# Patient Record
Sex: Female | Born: 1975 | Race: White | Hispanic: No | Smoking: Former smoker
Health system: Southern US, Community
[De-identification: ages and names within clinical notes are randomized; demographics above are authoritative.]

## PROBLEM LIST (undated history)

## (undated) DIAGNOSIS — F419 Anxiety disorder, unspecified: Secondary | ICD-10-CM

## (undated) DIAGNOSIS — F32A Depression, unspecified: Secondary | ICD-10-CM

## (undated) DIAGNOSIS — F1921 Other psychoactive substance dependence, in remission: Secondary | ICD-10-CM

## (undated) DIAGNOSIS — M1990 Unspecified osteoarthritis, unspecified site: Secondary | ICD-10-CM

## (undated) DIAGNOSIS — L409 Psoriasis, unspecified: Secondary | ICD-10-CM

## (undated) DIAGNOSIS — F329 Major depressive disorder, single episode, unspecified: Secondary | ICD-10-CM

## (undated) DIAGNOSIS — F1120 Opioid dependence, uncomplicated: Secondary | ICD-10-CM

## (undated) HISTORY — PX: TOOTH EXTRACTION: SUR596

## (undated) HISTORY — PX: ECTOPIC PREGNANCY SURGERY: SHX613

---

## 1997-08-15 ENCOUNTER — Other Ambulatory Visit: Admission: RE | Admit: 1997-08-15 | Discharge: 1997-08-15 | Payer: Self-pay | Admitting: Gynecology

## 1998-06-21 ENCOUNTER — Emergency Department (HOSPITAL_COMMUNITY): Admission: EM | Admit: 1998-06-21 | Discharge: 1998-06-21 | Payer: Self-pay | Admitting: Emergency Medicine

## 1998-08-12 ENCOUNTER — Emergency Department (HOSPITAL_COMMUNITY): Admission: EM | Admit: 1998-08-12 | Discharge: 1998-08-12 | Payer: Self-pay | Admitting: Emergency Medicine

## 1999-08-30 ENCOUNTER — Other Ambulatory Visit: Admission: RE | Admit: 1999-08-30 | Discharge: 1999-08-30 | Payer: Self-pay | Admitting: Gynecology

## 1999-08-30 ENCOUNTER — Ambulatory Visit (HOSPITAL_COMMUNITY): Admission: RE | Admit: 1999-08-30 | Discharge: 1999-08-30 | Payer: Self-pay | Admitting: Obstetrics and Gynecology

## 1999-08-30 ENCOUNTER — Encounter (INDEPENDENT_AMBULATORY_CARE_PROVIDER_SITE_OTHER): Payer: Self-pay

## 1999-12-26 ENCOUNTER — Other Ambulatory Visit: Admission: RE | Admit: 1999-12-26 | Discharge: 1999-12-26 | Payer: Self-pay | Admitting: Gynecology

## 2000-09-13 ENCOUNTER — Inpatient Hospital Stay (HOSPITAL_COMMUNITY): Admission: AD | Admit: 2000-09-13 | Discharge: 2000-09-13 | Payer: Self-pay | Admitting: Obstetrics and Gynecology

## 2000-09-13 ENCOUNTER — Encounter: Payer: Self-pay | Admitting: Gynecology

## 2000-09-15 ENCOUNTER — Other Ambulatory Visit: Admission: RE | Admit: 2000-09-15 | Discharge: 2000-09-15 | Payer: Self-pay | Admitting: Gynecology

## 2002-12-14 ENCOUNTER — Emergency Department (HOSPITAL_COMMUNITY): Admission: EM | Admit: 2002-12-14 | Discharge: 2002-12-14 | Payer: Self-pay | Admitting: Emergency Medicine

## 2003-01-20 ENCOUNTER — Inpatient Hospital Stay (HOSPITAL_COMMUNITY): Admission: AD | Admit: 2003-01-20 | Discharge: 2003-01-20 | Payer: Self-pay | Admitting: Obstetrics & Gynecology

## 2003-01-20 ENCOUNTER — Encounter: Payer: Self-pay | Admitting: Obstetrics & Gynecology

## 2003-01-26 ENCOUNTER — Inpatient Hospital Stay (HOSPITAL_COMMUNITY): Admission: AD | Admit: 2003-01-26 | Discharge: 2003-01-26 | Payer: Self-pay | Admitting: *Deleted

## 2003-06-17 ENCOUNTER — Emergency Department (HOSPITAL_COMMUNITY): Admission: EM | Admit: 2003-06-17 | Discharge: 2003-06-17 | Payer: Self-pay | Admitting: *Deleted

## 2003-09-02 ENCOUNTER — Inpatient Hospital Stay (HOSPITAL_COMMUNITY): Admission: AD | Admit: 2003-09-02 | Discharge: 2003-09-02 | Payer: Self-pay | Admitting: *Deleted

## 2003-09-05 ENCOUNTER — Inpatient Hospital Stay (HOSPITAL_COMMUNITY): Admission: AD | Admit: 2003-09-05 | Discharge: 2003-09-05 | Payer: Self-pay | Admitting: *Deleted

## 2003-09-15 ENCOUNTER — Other Ambulatory Visit: Admission: RE | Admit: 2003-09-15 | Discharge: 2003-09-15 | Payer: Self-pay | Admitting: Obstetrics and Gynecology

## 2003-10-27 ENCOUNTER — Ambulatory Visit (HOSPITAL_COMMUNITY): Admission: AD | Admit: 2003-10-27 | Discharge: 2003-10-27 | Payer: Self-pay | Admitting: Obstetrics and Gynecology

## 2003-10-27 ENCOUNTER — Encounter (INDEPENDENT_AMBULATORY_CARE_PROVIDER_SITE_OTHER): Payer: Self-pay | Admitting: *Deleted

## 2003-11-05 ENCOUNTER — Inpatient Hospital Stay (HOSPITAL_COMMUNITY): Admission: AD | Admit: 2003-11-05 | Discharge: 2003-11-05 | Payer: Self-pay | Admitting: Obstetrics and Gynecology

## 2003-11-06 ENCOUNTER — Emergency Department (HOSPITAL_COMMUNITY): Admission: EM | Admit: 2003-11-06 | Discharge: 2003-11-06 | Payer: Self-pay | Admitting: Unknown Physician Specialty

## 2004-02-08 ENCOUNTER — Emergency Department (HOSPITAL_COMMUNITY): Admission: EM | Admit: 2004-02-08 | Discharge: 2004-02-08 | Payer: Self-pay | Admitting: Emergency Medicine

## 2004-11-04 ENCOUNTER — Inpatient Hospital Stay: Admission: AD | Admit: 2004-11-04 | Discharge: 2004-11-04 | Payer: Self-pay | Admitting: Obstetrics & Gynecology

## 2005-02-10 ENCOUNTER — Emergency Department (HOSPITAL_COMMUNITY): Admission: EM | Admit: 2005-02-10 | Discharge: 2005-02-11 | Payer: Self-pay | Admitting: *Deleted

## 2005-02-11 ENCOUNTER — Ambulatory Visit: Payer: Self-pay | Admitting: Family Medicine

## 2005-02-12 ENCOUNTER — Ambulatory Visit: Payer: Self-pay | Admitting: Cardiology

## 2005-02-23 ENCOUNTER — Emergency Department (HOSPITAL_COMMUNITY): Admission: EM | Admit: 2005-02-23 | Discharge: 2005-02-23 | Payer: Self-pay | Admitting: Emergency Medicine

## 2005-02-28 ENCOUNTER — Ambulatory Visit: Payer: Self-pay | Admitting: Internal Medicine

## 2005-07-07 ENCOUNTER — Ambulatory Visit: Payer: Self-pay | Admitting: Family Medicine

## 2006-03-10 ENCOUNTER — Emergency Department (HOSPITAL_COMMUNITY): Admission: EM | Admit: 2006-03-10 | Discharge: 2006-03-10 | Payer: Self-pay | Admitting: Emergency Medicine

## 2006-03-12 ENCOUNTER — Emergency Department (HOSPITAL_COMMUNITY): Admission: EM | Admit: 2006-03-12 | Discharge: 2006-03-12 | Payer: Self-pay | Admitting: Emergency Medicine

## 2006-04-03 ENCOUNTER — Emergency Department (HOSPITAL_COMMUNITY): Admission: EM | Admit: 2006-04-03 | Discharge: 2006-04-03 | Payer: Self-pay | Admitting: Emergency Medicine

## 2006-04-08 ENCOUNTER — Emergency Department (HOSPITAL_COMMUNITY): Admission: EM | Admit: 2006-04-08 | Discharge: 2006-04-08 | Payer: Self-pay | Admitting: Family Medicine

## 2006-04-29 ENCOUNTER — Emergency Department (HOSPITAL_COMMUNITY): Admission: EM | Admit: 2006-04-29 | Discharge: 2006-04-29 | Payer: Self-pay | Admitting: Family Medicine

## 2006-07-09 ENCOUNTER — Emergency Department (HOSPITAL_COMMUNITY): Admission: EM | Admit: 2006-07-09 | Discharge: 2006-07-10 | Payer: Self-pay | Admitting: *Deleted

## 2006-07-23 ENCOUNTER — Emergency Department (HOSPITAL_COMMUNITY): Admission: EM | Admit: 2006-07-23 | Discharge: 2006-07-23 | Payer: Self-pay | Admitting: Family Medicine

## 2006-08-06 ENCOUNTER — Emergency Department (HOSPITAL_COMMUNITY): Admission: EM | Admit: 2006-08-06 | Discharge: 2006-08-06 | Payer: Self-pay | Admitting: Emergency Medicine

## 2006-08-27 ENCOUNTER — Emergency Department (HOSPITAL_COMMUNITY): Admission: EM | Admit: 2006-08-27 | Discharge: 2006-08-27 | Payer: Self-pay | Admitting: Emergency Medicine

## 2006-09-27 ENCOUNTER — Emergency Department (HOSPITAL_COMMUNITY): Admission: EM | Admit: 2006-09-27 | Discharge: 2006-09-27 | Payer: Self-pay | Admitting: Emergency Medicine

## 2006-10-14 ENCOUNTER — Emergency Department (HOSPITAL_COMMUNITY): Admission: EM | Admit: 2006-10-14 | Discharge: 2006-10-14 | Payer: Self-pay | Admitting: Family Medicine

## 2006-10-26 ENCOUNTER — Ambulatory Visit: Payer: Self-pay | Admitting: Family Medicine

## 2007-01-01 DIAGNOSIS — R42 Dizziness and giddiness: Secondary | ICD-10-CM

## 2007-01-01 DIAGNOSIS — R519 Headache, unspecified: Secondary | ICD-10-CM | POA: Insufficient documentation

## 2007-01-01 DIAGNOSIS — F3289 Other specified depressive episodes: Secondary | ICD-10-CM | POA: Insufficient documentation

## 2007-01-01 DIAGNOSIS — R51 Headache: Secondary | ICD-10-CM

## 2007-01-01 DIAGNOSIS — F329 Major depressive disorder, single episode, unspecified: Secondary | ICD-10-CM | POA: Insufficient documentation

## 2007-01-13 ENCOUNTER — Emergency Department (HOSPITAL_COMMUNITY): Admission: EM | Admit: 2007-01-13 | Discharge: 2007-01-13 | Payer: Self-pay | Admitting: Family Medicine

## 2007-01-18 ENCOUNTER — Emergency Department (HOSPITAL_COMMUNITY): Admission: EM | Admit: 2007-01-18 | Discharge: 2007-01-18 | Payer: Self-pay | Admitting: Family Medicine

## 2007-05-15 ENCOUNTER — Emergency Department (HOSPITAL_COMMUNITY): Admission: EM | Admit: 2007-05-15 | Discharge: 2007-05-15 | Payer: Self-pay | Admitting: Family Medicine

## 2007-06-09 ENCOUNTER — Ambulatory Visit: Payer: Self-pay | Admitting: Family Medicine

## 2007-06-09 DIAGNOSIS — M542 Cervicalgia: Secondary | ICD-10-CM

## 2007-06-16 ENCOUNTER — Telehealth: Payer: Self-pay | Admitting: Family Medicine

## 2007-06-19 ENCOUNTER — Encounter: Admission: RE | Admit: 2007-06-19 | Discharge: 2007-06-19 | Payer: Self-pay | Admitting: Family Medicine

## 2007-06-22 ENCOUNTER — Emergency Department (HOSPITAL_COMMUNITY): Admission: EM | Admit: 2007-06-22 | Discharge: 2007-06-22 | Payer: Self-pay | Admitting: Family Medicine

## 2007-06-29 ENCOUNTER — Encounter: Payer: Self-pay | Admitting: Family Medicine

## 2007-08-11 ENCOUNTER — Telehealth: Payer: Self-pay | Admitting: Family Medicine

## 2007-10-11 ENCOUNTER — Emergency Department (HOSPITAL_COMMUNITY): Admission: EM | Admit: 2007-10-11 | Discharge: 2007-10-11 | Payer: Self-pay | Admitting: Family Medicine

## 2007-11-29 ENCOUNTER — Ambulatory Visit: Payer: Self-pay | Admitting: Family Medicine

## 2007-12-06 ENCOUNTER — Emergency Department (HOSPITAL_COMMUNITY): Admission: EM | Admit: 2007-12-06 | Discharge: 2007-12-06 | Payer: Self-pay | Admitting: Family Medicine

## 2007-12-08 ENCOUNTER — Telehealth: Payer: Self-pay | Admitting: Family Medicine

## 2007-12-12 ENCOUNTER — Emergency Department (HOSPITAL_COMMUNITY): Admission: EM | Admit: 2007-12-12 | Discharge: 2007-12-13 | Payer: Self-pay | Admitting: Emergency Medicine

## 2007-12-20 ENCOUNTER — Telehealth: Payer: Self-pay | Admitting: Family Medicine

## 2007-12-28 ENCOUNTER — Emergency Department (HOSPITAL_COMMUNITY): Admission: EM | Admit: 2007-12-28 | Discharge: 2007-12-29 | Payer: Self-pay | Admitting: Emergency Medicine

## 2008-01-06 ENCOUNTER — Ambulatory Visit: Payer: Self-pay | Admitting: Family Medicine

## 2008-01-06 DIAGNOSIS — M129 Arthropathy, unspecified: Secondary | ICD-10-CM | POA: Insufficient documentation

## 2008-01-06 DIAGNOSIS — L03211 Cellulitis of face: Secondary | ICD-10-CM

## 2008-01-06 DIAGNOSIS — L0201 Cutaneous abscess of face: Secondary | ICD-10-CM

## 2008-01-20 ENCOUNTER — Encounter: Payer: Self-pay | Admitting: Family Medicine

## 2008-02-15 ENCOUNTER — Telehealth: Payer: Self-pay | Admitting: Family Medicine

## 2008-02-23 ENCOUNTER — Telehealth: Payer: Self-pay | Admitting: Family Medicine

## 2008-02-28 ENCOUNTER — Telehealth: Payer: Self-pay | Admitting: Family Medicine

## 2008-04-22 ENCOUNTER — Emergency Department (HOSPITAL_COMMUNITY): Admission: EM | Admit: 2008-04-22 | Discharge: 2008-04-22 | Payer: Self-pay | Admitting: Emergency Medicine

## 2008-05-26 ENCOUNTER — Encounter: Payer: Self-pay | Admitting: Family Medicine

## 2008-06-13 ENCOUNTER — Telehealth: Payer: Self-pay | Admitting: Family Medicine

## 2008-07-03 ENCOUNTER — Telehealth: Payer: Self-pay | Admitting: Family Medicine

## 2008-07-08 ENCOUNTER — Emergency Department (HOSPITAL_COMMUNITY): Admission: EM | Admit: 2008-07-08 | Discharge: 2008-07-08 | Payer: Self-pay | Admitting: Emergency Medicine

## 2008-07-08 ENCOUNTER — Telehealth: Payer: Self-pay | Admitting: Family Medicine

## 2008-07-11 ENCOUNTER — Ambulatory Visit: Payer: Self-pay | Admitting: Family Medicine

## 2008-07-11 DIAGNOSIS — F3160 Bipolar disorder, current episode mixed, unspecified: Secondary | ICD-10-CM | POA: Insufficient documentation

## 2008-08-01 ENCOUNTER — Telehealth (INDEPENDENT_AMBULATORY_CARE_PROVIDER_SITE_OTHER): Payer: Self-pay | Admitting: *Deleted

## 2008-08-15 ENCOUNTER — Inpatient Hospital Stay (HOSPITAL_COMMUNITY): Admission: AD | Admit: 2008-08-15 | Discharge: 2008-08-15 | Payer: Self-pay | Admitting: Obstetrics & Gynecology

## 2008-08-18 ENCOUNTER — Telehealth: Payer: Self-pay | Admitting: Family Medicine

## 2008-10-04 ENCOUNTER — Telehealth: Payer: Self-pay | Admitting: Family Medicine

## 2008-10-04 ENCOUNTER — Ambulatory Visit: Payer: Self-pay | Admitting: Family Medicine

## 2008-10-10 ENCOUNTER — Emergency Department (HOSPITAL_COMMUNITY): Admission: EM | Admit: 2008-10-10 | Discharge: 2008-10-10 | Payer: Self-pay | Admitting: Emergency Medicine

## 2008-10-29 ENCOUNTER — Emergency Department (HOSPITAL_COMMUNITY): Admission: EM | Admit: 2008-10-29 | Discharge: 2008-10-29 | Payer: Self-pay | Admitting: Emergency Medicine

## 2008-11-07 ENCOUNTER — Telehealth: Payer: Self-pay | Admitting: Family Medicine

## 2008-11-28 ENCOUNTER — Emergency Department (HOSPITAL_COMMUNITY): Admission: EM | Admit: 2008-11-28 | Discharge: 2008-11-28 | Payer: Self-pay | Admitting: Emergency Medicine

## 2009-01-19 ENCOUNTER — Emergency Department (HOSPITAL_COMMUNITY): Admission: EM | Admit: 2009-01-19 | Discharge: 2009-01-20 | Payer: Self-pay | Admitting: Emergency Medicine

## 2009-02-04 ENCOUNTER — Emergency Department (HOSPITAL_COMMUNITY): Admission: EM | Admit: 2009-02-04 | Discharge: 2009-02-04 | Payer: Self-pay | Admitting: Emergency Medicine

## 2009-02-06 ENCOUNTER — Emergency Department (HOSPITAL_COMMUNITY): Admission: EM | Admit: 2009-02-06 | Discharge: 2009-02-06 | Payer: Self-pay | Admitting: Emergency Medicine

## 2009-02-06 ENCOUNTER — Telehealth: Payer: Self-pay | Admitting: Family Medicine

## 2009-02-17 ENCOUNTER — Emergency Department (HOSPITAL_COMMUNITY): Admission: EM | Admit: 2009-02-17 | Discharge: 2009-02-17 | Payer: Self-pay | Admitting: Emergency Medicine

## 2009-03-12 ENCOUNTER — Inpatient Hospital Stay (HOSPITAL_COMMUNITY): Admission: AD | Admit: 2009-03-12 | Discharge: 2009-03-12 | Payer: Self-pay | Admitting: Obstetrics and Gynecology

## 2009-04-04 ENCOUNTER — Emergency Department (HOSPITAL_COMMUNITY): Admission: EM | Admit: 2009-04-04 | Discharge: 2009-04-04 | Payer: Self-pay | Admitting: Emergency Medicine

## 2009-04-17 ENCOUNTER — Emergency Department (HOSPITAL_COMMUNITY): Admission: EM | Admit: 2009-04-17 | Discharge: 2009-04-18 | Payer: Self-pay | Admitting: Emergency Medicine

## 2009-04-22 ENCOUNTER — Emergency Department (HOSPITAL_COMMUNITY): Admission: EM | Admit: 2009-04-22 | Discharge: 2009-04-22 | Payer: Self-pay | Admitting: Emergency Medicine

## 2009-05-03 ENCOUNTER — Encounter: Payer: Self-pay | Admitting: Family Medicine

## 2009-05-04 ENCOUNTER — Telehealth: Payer: Self-pay | Admitting: Family Medicine

## 2009-05-10 ENCOUNTER — Encounter: Admission: RE | Admit: 2009-05-10 | Discharge: 2009-05-10 | Payer: Self-pay | Admitting: Neurosurgery

## 2009-05-12 ENCOUNTER — Emergency Department: Payer: Self-pay | Admitting: Emergency Medicine

## 2009-05-13 ENCOUNTER — Emergency Department (HOSPITAL_COMMUNITY): Admission: EM | Admit: 2009-05-13 | Discharge: 2009-05-13 | Payer: Self-pay | Admitting: Emergency Medicine

## 2009-05-16 ENCOUNTER — Emergency Department: Payer: Self-pay | Admitting: Emergency Medicine

## 2009-05-27 ENCOUNTER — Emergency Department: Payer: Self-pay | Admitting: Unknown Physician Specialty

## 2009-05-29 ENCOUNTER — Telehealth: Payer: Self-pay | Admitting: Family Medicine

## 2009-05-30 ENCOUNTER — Telehealth: Payer: Self-pay | Admitting: Family Medicine

## 2009-05-30 ENCOUNTER — Ambulatory Visit: Payer: Self-pay | Admitting: Family Medicine

## 2009-07-10 ENCOUNTER — Telehealth: Payer: Self-pay | Admitting: Family Medicine

## 2009-07-13 ENCOUNTER — Ambulatory Visit: Payer: Self-pay | Admitting: Family Medicine

## 2009-07-13 DIAGNOSIS — K5289 Other specified noninfective gastroenteritis and colitis: Secondary | ICD-10-CM

## 2009-08-20 ENCOUNTER — Emergency Department (HOSPITAL_COMMUNITY): Admission: EM | Admit: 2009-08-20 | Discharge: 2009-08-20 | Payer: Self-pay | Admitting: Emergency Medicine

## 2009-10-17 ENCOUNTER — Emergency Department (HOSPITAL_COMMUNITY): Admission: EM | Admit: 2009-10-17 | Discharge: 2009-10-17 | Payer: Self-pay | Admitting: Emergency Medicine

## 2009-11-25 ENCOUNTER — Emergency Department (HOSPITAL_COMMUNITY): Admission: EM | Admit: 2009-11-25 | Discharge: 2009-11-25 | Payer: Self-pay | Admitting: Emergency Medicine

## 2010-03-16 ENCOUNTER — Emergency Department (HOSPITAL_COMMUNITY): Admission: EM | Admit: 2010-03-16 | Discharge: 2010-03-17 | Payer: Self-pay | Admitting: Emergency Medicine

## 2010-04-29 ENCOUNTER — Emergency Department (HOSPITAL_COMMUNITY)
Admission: EM | Admit: 2010-04-29 | Discharge: 2010-04-30 | Payer: Self-pay | Source: Home / Self Care | Admitting: Emergency Medicine

## 2010-05-07 ENCOUNTER — Emergency Department (HOSPITAL_COMMUNITY)
Admission: EM | Admit: 2010-05-07 | Discharge: 2010-05-08 | Payer: Self-pay | Source: Home / Self Care | Admitting: Emergency Medicine

## 2010-05-08 LAB — URINALYSIS, ROUTINE W REFLEX MICROSCOPIC
Bilirubin Urine: NEGATIVE
Ketones, ur: NEGATIVE mg/dL
Leukocytes, UA: NEGATIVE
Nitrite: NEGATIVE
Protein, ur: NEGATIVE mg/dL
Specific Gravity, Urine: 1.006 (ref 1.005–1.030)
Urine Glucose, Fasting: NEGATIVE mg/dL
Urobilinogen, UA: 0.2 mg/dL (ref 0.0–1.0)
pH: 6.5 (ref 5.0–8.0)

## 2010-05-08 LAB — POCT I-STAT, CHEM 8
BUN: 6 mg/dL (ref 6–23)
Calcium, Ion: 1.06 mmol/L — ABNORMAL LOW (ref 1.12–1.32)
Chloride: 108 mEq/L (ref 96–112)
Creatinine, Ser: 1 mg/dL (ref 0.4–1.2)
Glucose, Bld: 95 mg/dL (ref 70–99)
HCT: 42 % (ref 36.0–46.0)
Hemoglobin: 14.3 g/dL (ref 12.0–15.0)
Potassium: 4.3 mEq/L (ref 3.5–5.1)
Sodium: 140 mEq/L (ref 135–145)
TCO2: 22 mmol/L (ref 0–100)

## 2010-05-08 LAB — URINE MICROSCOPIC-ADD ON

## 2010-05-08 LAB — POCT PREGNANCY, URINE: Preg Test, Ur: NEGATIVE

## 2010-05-11 ENCOUNTER — Emergency Department: Payer: Self-pay | Admitting: Emergency Medicine

## 2010-05-12 ENCOUNTER — Encounter: Payer: Self-pay | Admitting: Obstetrics & Gynecology

## 2010-05-23 NOTE — Progress Notes (Signed)
Summary: Patient needs something called in for N&V  Phone Note Call from Patient Call back at [phone]   Caller: Patient Call For: Nelwyn Salisbury MD Reason for Call: Acute Illness Summary of Call: Sick for 2 days with vomitting and diarrhea - patient currently has no health insurance and would like Dr. Clent Ridges to call something in for her.  Initial call taken by: Everrett Coombe,  July 10, 2009 3:17 PM  Follow-up for Phone Call        use Imodium AD, also call in Phenergan 25 mg tabs, use q 4 hours as needed nausea, #30 with no rf Follow-up by: Nelwyn Salisbury MD,  July 10, 2009 4:19 PM  Additional Follow-up for Phone Call Additional follow up Details #1::        Rx Called In Additional Follow-up by: Raechel Ache, RN,  July 10, 2009 4:38 PM

## 2010-05-23 NOTE — Letter (Signed)
Summary: [facility]  [facility]   Imported By: Maryln Gottron 05/10/2009 13:47:37  _____________________________________________________________________  External Attachment:    Type:   Image     Comment:   External Document

## 2010-05-23 NOTE — Progress Notes (Signed)
Summary: refill hydrocodone  Phone Note From Pharmacy   Caller: [facility]  [place]. #[phone]* Call For: fry  Summary of Call: refill hydrocodone 5/500 1 by mouth every 6 hours as needed for pain Initial call taken by: Alfred Levins, CMA,  May 04, 2009 11:58 AM  Follow-up for Phone Call        call in #30 only. She needs to see me before any more after that Follow-up by: Nelwyn Salisbury MD,  May 04, 2009 1:24 PM  Additional Follow-up for Phone Call Additional follow up Details #1::        Phone call completed, Pharmacist called Additional Follow-up by: Alfred Levins, CMA,  May 04, 2009 1:25 PM    New/Updated Medications: HYDROCODONE-ACETAMINOPHEN 5-500 MG TABS (HYDROCODONE-ACETAMINOPHEN) 1 by mouth every 6 hours as needed for pain Prescriptions: HYDROCODONE-ACETAMINOPHEN 5-500 MG TABS (HYDROCODONE-ACETAMINOPHEN) 1 by mouth every 6 hours as needed for pain  #30 x 0   Entered by:   Alfred Levins, CMA   Authorized by:   Nelwyn Salisbury MD   Signed by:   Alfred Levins, CMA on 05/04/2009   Method used:   Telephoned to ...       [facility]  [place]. [phone]* (retail)       [address]       [place], [place]  [address]       Ph: [phone]       Fax: [phone]   RxID:   [id number]

## 2010-05-23 NOTE — Progress Notes (Signed)
Summary: vicodin & valium for numbness flareup  Phone Note Call from Patient Call back at [facility] Phone [phone]   Summary of Call: rEQUESTING REFILLS OF vICODIN & vALIUM BECAUSE OF FLAREUP OF NUMBNESS IN FINGERS, cervical disc flattened.  [place] Duck, HP Initial call taken by: Rudy Jew, RN,  May 29, 2009 12:26 PM  Follow-up for Phone Call        NO she needs an OV first Follow-up by: Nelwyn Salisbury MD,  May 29, 2009 1:47 PM  Additional Follow-up for Phone Call Additional follow up Details #1::        Set ov for tomorrow. Additional Follow-up by: Rudy Jew, RN,  May 29, 2009 1:55 PM

## 2010-05-23 NOTE — Progress Notes (Signed)
Summary: refill hydrocodone  Phone Note From Pharmacy   Caller: [facility]  [place]. #[phone]* Call For: fry  Summary of Call: refill hydrocodone 5/500 1 by mouth every 6 hours as needed for pain  Pt no showed for appt today Initial call taken by: Alfred Levins, CMA,  May 30, 2009 12:13 PM  Follow-up for Phone Call        NO not without an OV Follow-up by: Nelwyn Salisbury MD,  May 30, 2009 1:43 PM  Additional Follow-up for Phone Call Additional follow up Details #1::        Phone call completed, Pharmacist called Additional Follow-up by: Alfred Levins, CMA,  May 30, 2009 2:22 PM

## 2010-05-23 NOTE — Assessment & Plan Note (Signed)
Summary: FU ON NAUSEA/NJR   Vital Signs:  Patient profile:   35 year old female Weight:      208 pounds BMI:     28.31 Temp:     98 degrees F oral BP sitting:   130 / 84  (left arm) Cuff size:   regular  Vitals Entered By: Raechel Ache, RN (July 13, 2009 11:20 AM) CC: Sick 1-2 weeks with nausea, vomiting, sleeping all the time, fever and diarrhea. Also asking to go back on Prozac.   History of Present Illness: Here for 10 days of weakness, nausea and vomitting, fever to 101 degrees, mild cramps, and some diarrhea. She is using Phenergan, and this helps a lot. She has felt better for the past few days, and thinks she is over the worst of it. No respiratory or urinary symptoms. Drinking fluids. Also she wants to start back on Prozac for her depression. She had been seeing the [facility] for this, but she stopped seeing thme 4 months ago. She stopped taking all her depression meds 4 months ago as well because of side effects like weight gain, drowsiness, and hair loss.   Allergies: 1)  ! Pcn 2)  ! Sulfa  Past History:  Past Medical History: Reviewed history from 07/11/2008 and no changes required. Fainting Spells Ulcers Migraines Kidney Stones UTIs Herpes Depression Dizziness or vertigo psoriasis, sees Dr. Jorja Loa diffuse joint pains, sees Dr. Corliss Skains for rheumatology care neck and bilateral arm pains, sees Dr. Tressie Stalker  Review of Systems  The patient denies anorexia, fever, weight loss, weight gain, vision loss, decreased hearing, hoarseness, chest pain, syncope, dyspnea on exertion, peripheral edema, prolonged cough, headaches, hemoptysis, melena, hematochezia, severe indigestion/heartburn, hematuria, incontinence, genital sores, muscle weakness, suspicious skin lesions, transient blindness, difficulty walking, unusual weight change, abnormal bleeding, enlarged lymph nodes, angioedema, breast masses, and testicular masses.    Physical Exam  General:   Well-developed,well-nourished,in no acute distress; alert,appropriate and cooperative throughout examination Lungs:  Normal respiratory effort, chest expands symmetrically. Lungs are clear to auscultation, no crackles or wheezes. Heart:  Normal rate and regular rhythm. S1 and S2 normal without gallop, murmur, click, rub or other extra sounds. Abdomen:  Bowel sounds positive,abdomen soft and non-tender without masses, organomegaly or hernias noted. Psych:  Cognition and judgment appear intact. Alert and cooperative with normal attention span and concentration. No apparent delusions, illusions, hallucinations   Impression & Recommendations:  Problem # 1:  GASTROENTERITIS (ICD-558.9)  Problem # 2:  DEPRESSION (ICD-311)  The following medications were removed from the medication list:    Clonazepam 0.5 Mg Tabs (Clonazepam) .Marland Kitchen... Three times a day as needed anxiety Her updated medication list for this problem includes:    Prozac 40 Mg Caps (Fluoxetine hcl) ..... Once daily  Complete Medication List: 1)  Imitrex 100 Mg Tabs (Sumatriptan succinate) .... As needed 2)  Humira Pen 40 Mg/0.33ml Kit (Adalimumab) .... Every other week 3)  Hydrocodone-acetaminophen 5-500 Mg Tabs (Hydrocodone-acetaminophen) .Marland Kitchen.. 1 by mouth every 6 hours as needed for pain 4)  Prozac 40 Mg Caps (Fluoxetine hcl) .... Once daily  Patient Instructions: 1)  This seems to be a viral illness, and she seems to be over the worst of it. Continue fluids and Phenergan as needed . she should be back to normal by next week. Start back on Prozac.  2)  Please schedule a follow-up appointment in 1 month. Given a work excuse for 07-09-09 through 07-12-09. Prescriptions: PROZAC 40 MG CAPS (FLUOXETINE HCL) once  daily  #30 x 5   Entered and Authorized by:   Nelwyn Salisbury MD   Signed by:   Nelwyn Salisbury MD on 07/13/2009   Method used:   Electronically to        [facility]. [phone]* (retail)       [address]        [place], [place]  [address]       Ph: [phone]       Fax: [phone]   RxID:   [id number]

## 2010-07-05 LAB — CBC
HCT: 41.5 % (ref 36.0–46.0)
MCHC: 33 g/dL (ref 30.0–36.0)
MCV: 94.7 fL (ref 78.0–100.0)
Platelets: 235 10*3/uL (ref 150–400)
RDW: 14.2 % (ref 11.5–15.5)
WBC: 7.6 10*3/uL (ref 4.0–10.5)

## 2010-07-05 LAB — COMPREHENSIVE METABOLIC PANEL
Albumin: 3.4 g/dL — ABNORMAL LOW (ref 3.5–5.2)
BUN: 5 mg/dL — ABNORMAL LOW (ref 6–23)
Calcium: 8.5 mg/dL (ref 8.4–10.5)
Creatinine, Ser: 0.81 mg/dL (ref 0.4–1.2)
Glucose, Bld: 102 mg/dL — ABNORMAL HIGH (ref 70–99)
Potassium: 4 mEq/L (ref 3.5–5.1)
Total Protein: 5.7 g/dL — ABNORMAL LOW (ref 6.0–8.3)

## 2010-07-05 LAB — DIFFERENTIAL
Basophils Relative: 1 % (ref 0–1)
Lymphocytes Relative: 33 % (ref 12–46)
Lymphs Abs: 2.5 10*3/uL (ref 0.7–4.0)
Monocytes Absolute: 0.4 10*3/uL (ref 0.1–1.0)
Monocytes Relative: 6 % (ref 3–12)
Neutro Abs: 4.4 10*3/uL (ref 1.7–7.7)
Neutrophils Relative %: 58 % (ref 43–77)

## 2010-07-05 LAB — URINE CULTURE
Colony Count: NO GROWTH
Culture  Setup Time: 201108071756

## 2010-07-05 LAB — URINALYSIS, ROUTINE W REFLEX MICROSCOPIC
Bilirubin Urine: NEGATIVE
Glucose, UA: NEGATIVE mg/dL
Ketones, ur: NEGATIVE mg/dL
Specific Gravity, Urine: 1.005 (ref 1.005–1.030)
pH: 7 (ref 5.0–8.0)

## 2010-07-05 LAB — POCT PREGNANCY, URINE: Preg Test, Ur: NEGATIVE

## 2010-07-18 ENCOUNTER — Emergency Department (HOSPITAL_COMMUNITY): Payer: Medicaid Other

## 2010-07-18 ENCOUNTER — Emergency Department (HOSPITAL_COMMUNITY)
Admission: EM | Admit: 2010-07-18 | Discharge: 2010-07-18 | Disposition: A | Discharge status: Home / Self Care | Payer: Medicaid Other | Attending: Emergency Medicine | Admitting: Emergency Medicine

## 2010-07-18 DIAGNOSIS — F3289 Other specified depressive episodes: Secondary | ICD-10-CM | POA: Insufficient documentation

## 2010-07-18 DIAGNOSIS — F329 Major depressive disorder, single episode, unspecified: Secondary | ICD-10-CM | POA: Insufficient documentation

## 2010-07-18 DIAGNOSIS — R1013 Epigastric pain: Secondary | ICD-10-CM | POA: Insufficient documentation

## 2010-07-18 DIAGNOSIS — Z79899 Other long term (current) drug therapy: Secondary | ICD-10-CM | POA: Insufficient documentation

## 2010-07-18 DIAGNOSIS — R070 Pain in throat: Secondary | ICD-10-CM | POA: Insufficient documentation

## 2010-07-18 DIAGNOSIS — J45909 Unspecified asthma, uncomplicated: Secondary | ICD-10-CM | POA: Insufficient documentation

## 2010-07-18 DIAGNOSIS — F17200 Nicotine dependence, unspecified, uncomplicated: Secondary | ICD-10-CM | POA: Insufficient documentation

## 2010-07-18 DIAGNOSIS — R51 Headache: Secondary | ICD-10-CM | POA: Insufficient documentation

## 2010-07-18 DIAGNOSIS — R079 Chest pain, unspecified: Secondary | ICD-10-CM | POA: Insufficient documentation

## 2010-07-18 DIAGNOSIS — J189 Pneumonia, unspecified organism: Secondary | ICD-10-CM | POA: Insufficient documentation

## 2010-07-18 DIAGNOSIS — R110 Nausea: Secondary | ICD-10-CM | POA: Insufficient documentation

## 2010-07-18 DIAGNOSIS — J3489 Other specified disorders of nose and nasal sinuses: Secondary | ICD-10-CM | POA: Insufficient documentation

## 2010-07-18 LAB — COMPREHENSIVE METABOLIC PANEL
ALT: 15 U/L (ref 0–35)
AST: 17 U/L (ref 0–37)
Alkaline Phosphatase: 57 U/L (ref 39–117)
Calcium: 8.7 mg/dL (ref 8.4–10.5)
GFR calc Af Amer: >60 mL/min (ref >60)
Potassium: 4 mEq/L (ref 3.5–5.1)
Sodium: 138 mEq/L (ref 135–145)
Total Protein: 6.9 g/dL (ref 6.0–8.3)

## 2010-07-18 LAB — CBC
Hemoglobin: 12.2 g/dL (ref 12.0–15.0)
MCHC: 33.2 g/dL (ref 30.0–36.0)
RDW: 13.6 % (ref 11.5–15.5)
WBC: 5.7 10*3/uL (ref 4.0–10.5)

## 2010-07-18 LAB — DIFFERENTIAL
Basophils Absolute: 0 10*3/uL (ref 0.0–0.1)
Basophils Relative: 1 % (ref 0–1)
Eosinophils Relative: 1 % (ref 0–5)
Lymphocytes Relative: 31 % (ref 12–46)
Monocytes Absolute: 0.4 10*3/uL (ref 0.1–1.0)
Neutro Abs: 3.5 10*3/uL (ref 1.7–7.7)

## 2010-07-18 LAB — URINALYSIS, ROUTINE W REFLEX MICROSCOPIC
Bilirubin Urine: NEGATIVE
Nitrite: NEGATIVE
Specific Gravity, Urine: 1.005 (ref 1.005–1.030)
Urobilinogen, UA: 0.2 mg/dL (ref 0.0–1.0)
pH: 7 (ref 5.0–8.0)

## 2010-07-18 LAB — POCT PREGNANCY, URINE: Preg Test, Ur: NEGATIVE

## 2010-07-22 ENCOUNTER — Emergency Department (HOSPITAL_COMMUNITY): Payer: Medicaid Other

## 2010-07-22 ENCOUNTER — Emergency Department (HOSPITAL_COMMUNITY)
Admission: EM | Admit: 2010-07-22 | Discharge: 2010-07-22 | Disposition: A | Discharge status: Home / Self Care | Payer: Medicaid Other | Attending: Emergency Medicine | Admitting: Emergency Medicine

## 2010-07-22 DIAGNOSIS — M545 Low back pain, unspecified: Secondary | ICD-10-CM | POA: Insufficient documentation

## 2010-07-22 DIAGNOSIS — M542 Cervicalgia: Secondary | ICD-10-CM | POA: Insufficient documentation

## 2010-07-22 DIAGNOSIS — M2550 Pain in unspecified joint: Secondary | ICD-10-CM | POA: Insufficient documentation

## 2010-07-22 DIAGNOSIS — R5381 Other malaise: Secondary | ICD-10-CM | POA: Insufficient documentation

## 2010-07-22 DIAGNOSIS — J189 Pneumonia, unspecified organism: Secondary | ICD-10-CM | POA: Insufficient documentation

## 2010-07-22 DIAGNOSIS — F17200 Nicotine dependence, unspecified, uncomplicated: Secondary | ICD-10-CM | POA: Insufficient documentation

## 2010-07-22 DIAGNOSIS — M546 Pain in thoracic spine: Secondary | ICD-10-CM | POA: Insufficient documentation

## 2010-07-22 DIAGNOSIS — R079 Chest pain, unspecified: Secondary | ICD-10-CM | POA: Insufficient documentation

## 2010-07-22 DIAGNOSIS — J45909 Unspecified asthma, uncomplicated: Secondary | ICD-10-CM | POA: Insufficient documentation

## 2010-07-22 DIAGNOSIS — R05 Cough: Secondary | ICD-10-CM | POA: Insufficient documentation

## 2010-07-22 DIAGNOSIS — IMO0001 Reserved for inherently not codable concepts without codable children: Secondary | ICD-10-CM | POA: Insufficient documentation

## 2010-07-22 DIAGNOSIS — R509 Fever, unspecified: Secondary | ICD-10-CM | POA: Insufficient documentation

## 2010-07-22 DIAGNOSIS — R059 Cough, unspecified: Secondary | ICD-10-CM | POA: Insufficient documentation

## 2010-07-22 DIAGNOSIS — F3289 Other specified depressive episodes: Secondary | ICD-10-CM | POA: Insufficient documentation

## 2010-07-22 DIAGNOSIS — F329 Major depressive disorder, single episode, unspecified: Secondary | ICD-10-CM | POA: Insufficient documentation

## 2010-07-24 LAB — CBC
HCT: 40.9 % (ref 36.0–46.0)
Hemoglobin: 13.7 g/dL (ref 12.0–15.0)
MCHC: 33.5 g/dL (ref 30.0–36.0)
RBC: 4.2 MIL/uL (ref 3.87–5.11)

## 2010-07-24 LAB — BASIC METABOLIC PANEL
CO2: 24 mEq/L (ref 19–32)
Calcium: 9 mg/dL (ref 8.4–10.5)
Chloride: 107 mEq/L (ref 96–112)
GFR calc Af Amer: >60 mL/min (ref >60)
Potassium: 4.3 mEq/L (ref 3.5–5.1)
Sodium: 136 mEq/L (ref 135–145)

## 2010-07-24 LAB — WET PREP, GENITAL: Clue Cells Wet Prep HPF POC: NONE SEEN

## 2010-07-24 LAB — URINALYSIS, ROUTINE W REFLEX MICROSCOPIC
Bilirubin Urine: NEGATIVE
Glucose, UA: NEGATIVE mg/dL
Hgb urine dipstick: NEGATIVE
Specific Gravity, Urine: <1.005 — ABNORMAL LOW (ref 1.005–1.030)
pH: 6.5 (ref 5.0–8.0)

## 2010-07-24 LAB — DIFFERENTIAL
Basophils Relative: 1 % (ref 0–1)
Eosinophils Relative: 4 % (ref 0–5)
Monocytes Absolute: 0.5 10*3/uL (ref 0.1–1.0)
Monocytes Relative: 7 % (ref 3–12)
Neutro Abs: 2.9 10*3/uL (ref 1.7–7.7)

## 2010-07-24 LAB — GC/CHLAMYDIA PROBE AMP, GENITAL: Chlamydia, DNA Probe: NEGATIVE

## 2010-07-25 LAB — URINALYSIS, ROUTINE W REFLEX MICROSCOPIC
Ketones, ur: NEGATIVE mg/dL
Nitrite: NEGATIVE
Protein, ur: NEGATIVE mg/dL
pH: 6.5 (ref 5.0–8.0)

## 2010-07-26 ENCOUNTER — Emergency Department (HOSPITAL_COMMUNITY): Payer: Medicaid Other

## 2010-07-26 ENCOUNTER — Encounter (HOSPITAL_COMMUNITY): Payer: Self-pay | Admitting: Radiology

## 2010-07-26 ENCOUNTER — Emergency Department (HOSPITAL_COMMUNITY)
Admission: EM | Admit: 2010-07-26 | Discharge: 2010-07-26 | Disposition: A | Discharge status: Home / Self Care | Payer: Medicaid Other | Attending: Emergency Medicine | Admitting: Emergency Medicine

## 2010-07-26 DIAGNOSIS — R000 Tachycardia, unspecified: Secondary | ICD-10-CM | POA: Insufficient documentation

## 2010-07-26 DIAGNOSIS — F329 Major depressive disorder, single episode, unspecified: Secondary | ICD-10-CM | POA: Insufficient documentation

## 2010-07-26 DIAGNOSIS — R112 Nausea with vomiting, unspecified: Secondary | ICD-10-CM | POA: Insufficient documentation

## 2010-07-26 DIAGNOSIS — J329 Chronic sinusitis, unspecified: Secondary | ICD-10-CM | POA: Insufficient documentation

## 2010-07-26 DIAGNOSIS — R51 Headache: Secondary | ICD-10-CM | POA: Insufficient documentation

## 2010-07-26 DIAGNOSIS — J45909 Unspecified asthma, uncomplicated: Secondary | ICD-10-CM | POA: Insufficient documentation

## 2010-07-26 DIAGNOSIS — F3289 Other specified depressive episodes: Secondary | ICD-10-CM | POA: Insufficient documentation

## 2010-07-26 HISTORY — DX: Major depressive disorder, single episode, unspecified: F32.9

## 2010-07-26 HISTORY — DX: Depression, unspecified: F32.A

## 2010-07-26 HISTORY — DX: Other psychoactive substance dependence, in remission: F19.21

## 2010-07-26 HISTORY — DX: Opioid dependence, uncomplicated: F11.20

## 2010-07-26 LAB — URINALYSIS, ROUTINE W REFLEX MICROSCOPIC
Bilirubin Urine: NEGATIVE
Glucose, UA: NEGATIVE mg/dL
Hgb urine dipstick: NEGATIVE
Ketones, ur: >80 mg/dL — AB
Protein, ur: NEGATIVE mg/dL
Urobilinogen, UA: 0.2 mg/dL (ref 0.0–1.0)

## 2010-07-26 LAB — RAPID URINE DRUG SCREEN, HOSP PERFORMED
Amphetamines: NOT DETECTED
Barbiturates: NOT DETECTED
Benzodiazepines: NOT DETECTED
Tetrahydrocannabinol: POSITIVE — AB

## 2010-07-26 LAB — POCT I-STAT, CHEM 8
BUN: 7 mg/dL (ref 6–23)
Calcium, Ion: 1.02 mmol/L — ABNORMAL LOW (ref 1.12–1.32)
Creatinine, Ser: 0.8 mg/dL (ref 0.4–1.2)
Hemoglobin: 14.3 g/dL (ref 12.0–15.0)
TCO2: 24 mmol/L (ref 0–100)

## 2010-07-27 LAB — URINE CULTURE

## 2010-07-31 LAB — URINALYSIS, ROUTINE W REFLEX MICROSCOPIC
Glucose, UA: NEGATIVE mg/dL
Protein, ur: NEGATIVE mg/dL
Specific Gravity, Urine: <1.005 — ABNORMAL LOW (ref 1.005–1.030)

## 2010-07-31 LAB — CBC
HCT: 38.7 % (ref 36.0–46.0)
MCV: 96.5 fL (ref 78.0–100.0)
Platelets: 164 10*3/uL (ref 150–400)
RDW: 13.2 % (ref 11.5–15.5)

## 2010-07-31 LAB — COMPREHENSIVE METABOLIC PANEL
Albumin: 3.6 g/dL (ref 3.5–5.2)
BUN: 8 mg/dL (ref 6–23)
Creatinine, Ser: 0.65 mg/dL (ref 0.4–1.2)
Potassium: 4.2 mEq/L (ref 3.5–5.1)
Total Protein: 6 g/dL (ref 6.0–8.3)

## 2010-07-31 LAB — WET PREP, GENITAL
Trich, Wet Prep: NONE SEEN
Yeast Wet Prep HPF POC: NONE SEEN

## 2010-09-03 NOTE — Assessment & Plan Note (Signed)
[facility]                                 ON-CALL NOTE   NAME:GRILLSWynn, Kernes                       MRN:          [id number]  DATE:07/08/2008                            DOB:          06-Jul-1975    PRIMARY CARE PHYSICIAN:  Jeannett Senior A. Clent Ridges, MD   The patient's husband calls me again, second phone call today regarding  his wife, Alexandra Holt.  See details of prior dictation and phone note for  details.  They went to behavioral health, had an assessment.  Assessment  was that the patient should be admitted to Psychiatric Unit.  The  patient apparently was not deemed committable.  I was told that they  signed paperwork and left.  I wonder if this means that they signed out  against medical advice.  Husband called and asked me if there is  anything that I could call in for his wife.  I explained to him and to  the patient that under no circumstances would I think that that is  appropriate to call in psychiatric medications on a patient I have never  seen or evaluated and one who is acutely decompensating, who just got  out of drug rehabilitation.  I recommended that they go to [facility]  to the Psychiatric Assessment Unit.  I also recommended that if there  are no beds available at [facility] which is the case  today that possibly they could in the emergency room help facilitate a  transfer to [facility] or another Psychiatric Unit in the state.     Juleen China, MD    STC/MedQ  DD: 07/08/2008  DT: 07/09/2008  Job #: [id number]

## 2010-09-06 NOTE — Op Note (Signed)
Alexandra Holt, Alexandra Holt                       ACCOUNT NO.:  [id number]   MEDICAL RECORD NO.:  [id number]                   PATIENT TYPE:  AMB   LOCATION:  SDC                                  FACILITY:  [facility]   PHYSICIAN:  Carrington Clamp, M.D.              DATE OF BIRTH:  03-22-1976   DATE OF PROCEDURE:  10/27/2003  DATE OF DISCHARGE:                                 OPERATIVE REPORT   PREOPERATIVE DIAGNOSES:  Suspected ectopic pregnancy.   POSTOPERATIVE DIAGNOSES:  Left ectopic pregnancy.   PROCEDURE:  Diagnostic laparoscopy followed by left salpingectomy.   SURGEON:  Carrington Clamp, M.D.   ANESTHESIA:  General endotracheal anesthesia.   ESTIMATED BLOOD LOSS:  200 mL intraabdominally and minimal during the  procedure.   IV FLUIDS:  1000 mL.   URINE OUTPUT:  Not measured.   COMPLICATIONS:  None.   FINDINGS:  Left ampullary ectopic with positive hemoperitoneum. The right  tube was missing status post salpingectomy on that side. Normal uterus,  appendix, liver edge were seen. There was a small amount of endometriosis in  the broad ligament on the right hand side close to the ureter and therefore  could not be cauterized.   PATHOLOGY:  Left tube and ectopic.   MEDICATIONS:  None.   COUNTS:  Correct x3.   TECHNIQUE:  After adequate general anesthesia was achieved, the patient was  prepped and draped in the usual sterile fashion, dorsal lithotomy position,  speculum was placed in the vagina and uterine manipulator placed in the  cervix. The bladder was drained with a red rubber catheter during the  procedure. Attention was then turned to the abdomen where an infraumbilical  incision was made approximately 2 cm and the Veress needle passed into the  abdominal cavity without aspiration of bowel contents or blood.  The abdomen  was insufflated and then the 10 mm trocar passed without complications.  The  above findings were noted and a 5 mm trocar was then placed  suprapubically  through a stab incision made by a scalpel.  This was done under direct  visualization of the camera. A second 5 mm trocar was placed again after a  stab incision was made with the scalpel under direct visualization with the  camera on the right hand side.  A grasper was used to manipulate the left  tube and ectopic while triple polar cautery was then used to come across the  tube and underneath the mesosalpinx and remove the entire tube. Additional  cautery was used on the edges of the mesosalpinx that were proximal to the  body to ensure hemostasis. The ectopic was then placed in an endobag that  was placed through the 10 mm trocar site under visualization of the 5 mm  scope. The 10 mm scope was placed in the abdomen and the dissection field  was noted to be hemostatic. Irrigation was performed to remove as much of  the intraabdominal blood as possible. The endometriosis was noted but unable  to be cauterized adequately secondary to its location to the ureter.  All  instruments were then withdrawn from the abdomen and abdomen desufflated.  The 10 mm trocar site fascia was closed with a figure-of-eight stitch of 2-0  Vicryl. Two subcutaneous stitches were used to close the two smaller  incisions. Dermabond was used to close all of them, all instruments  withdrawn from the vagina and the patient tolerated the procedure well and  was returned to the recovery room in stable condition.                                               Carrington Clamp, M.D.    MH/MEDQ  D:  10/27/2003  T:  10/28/2003  Job:  [id number]

## 2010-09-06 NOTE — Op Note (Signed)
[facility]  Patient:    Alexandra Holt, Alexandra Holt                    MRN: [id number] Proc. Date: 08/30/99 Adm. Date:  [id number] Disc. Date: [id number] Attending:  Brynda Peon CC:         Leatha Gilding. Mezer, M.D.                           Operative Report  PREOPERATIVE DIAGNOSIS:       Probable right ectopic pregnancy.  POSTOPERATIVE DIAGNOSIS:      Right ectopic pregnancy.  OPERATION:                    Diagnostic laparoscopy, partial right salpingectomy.  SURGEON:                      Cynthia P. Ashley Royalty, M.D.  ASSISTANT:  ANESTHESIA:                   General endotracheal anesthesia.  ESTIMATED BLOOD LOSS:         Minimal.  COMPLICATIONS:                None.  FINDINGS:                     On inspection of the abdomen after placement of the laparoscope, the left tube and ovary were felt to be normal.  The fimbria were luxuriant.  There were no adhesions.  The anterior cul-de-sac was normal.  The posterior cul-de-sac had a moderate amount of blood.  On the right side, the ovary was normal and freely mobile.  The tube appeared normal until about the midisthmic area and from the midisthmic area out to the ampulla, it was ischemic, dilated, and there was clot at the fimbria.  DESCRIPTION OF PROCEDURE:     The patient was taken to the operating room and after the induction of adequate general endotracheal anesthesia, was placed in the dorsal lithotomy position and prepped and draped in the usual fashion.  The cervix was  visualized and a single tooth tenaculum was used to grasp the cervix on its anterior lip.  A Hulka uterine manipulator was placed.  The bladder was drained  with a red rubber catheter.  The IUD was removed intact by pulling on the string. This was easily accomplished.  Attention was next turned to the abdomen.  A vertical interumbilical incision was made.  The Veress needle was inserted into the peritoneal space.   Proper placement of the needle was tested by noting a negative aspirate and then free flow of saline through the Veress needle again with a negative aspirate and then by noting the response of a drop of saline placed at the hub of the needle to negative pressure as the abdominal wall was elevated.  The  pneumoperitoneum was created with 2.5 liters of CO2 using a Stryker automatic insufflator.  A disposable 10 mm trocar was inserted and its proper placement noted with the laparoscope.  The above findings were noted.  Two 5 mm ports were introduced in the right and left lower quadrants each time transilluminating to  avoid hitting a vessel with the trocar.  The tube was elevated with a blunt pickup and the tripolar cautery was used to excise the tube from the midisthmic area to the ampulla along the mesosalpinx.  The free end of the tube was grasped with a  pickup through the laparoscope and removed through the umbilical sleeve.  The clot that had been at the end of the tube was also removed and sent with the specimen. No bleeding was encountered.  The remainder of the abdomen appeared normal. The laparoscope and the instruments were removed from the abdomen.  The pneumoperitoneum was allowed to escape.  The trocar sleeves were removed.  The incisions were closed subcuticularly with 4-0 Vicryl Rapide.  The instruments were removed from the vagina and the procedure was terminated.  The incisions were injected with a total of 10 cc of 0.5% Marcaine with epinephrine. DD:  08/30/99 TD:  09/02/99 Job: [id number]

## 2011-01-15 ENCOUNTER — Emergency Department (HOSPITAL_COMMUNITY)
Admission: EM | Admit: 2011-01-15 | Discharge: 2011-01-15 | Disposition: A | Discharge status: Home / Self Care | Payer: Medicaid Other | Attending: Emergency Medicine | Admitting: Emergency Medicine

## 2011-01-15 DIAGNOSIS — F410 Panic disorder [episodic paroxysmal anxiety] without agoraphobia: Secondary | ICD-10-CM | POA: Insufficient documentation

## 2011-01-15 DIAGNOSIS — F19939 Other psychoactive substance use, unspecified with withdrawal, unspecified: Secondary | ICD-10-CM | POA: Insufficient documentation

## 2011-01-15 DIAGNOSIS — T50901A Poisoning by unspecified drugs, medicaments and biological substances, accidental (unintentional), initial encounter: Secondary | ICD-10-CM | POA: Insufficient documentation

## 2011-01-15 DIAGNOSIS — F1920 Other psychoactive substance dependence, uncomplicated: Secondary | ICD-10-CM | POA: Insufficient documentation

## 2011-01-15 DIAGNOSIS — T50904A Poisoning by unspecified drugs, medicaments and biological substances, undetermined, initial encounter: Secondary | ICD-10-CM | POA: Insufficient documentation

## 2011-01-22 ENCOUNTER — Other Ambulatory Visit (HOSPITAL_COMMUNITY): Payer: Medicaid Other

## 2011-01-22 ENCOUNTER — Emergency Department (HOSPITAL_COMMUNITY)
Admission: EM | Admit: 2011-01-22 | Discharge: 2011-01-22 | Disposition: A | Discharge status: Home / Self Care | Payer: Self-pay | Attending: Emergency Medicine | Admitting: Emergency Medicine

## 2011-01-22 DIAGNOSIS — M7989 Other specified soft tissue disorders: Secondary | ICD-10-CM

## 2011-01-22 DIAGNOSIS — R252 Cramp and spasm: Secondary | ICD-10-CM | POA: Insufficient documentation

## 2011-01-22 DIAGNOSIS — M79609 Pain in unspecified limb: Secondary | ICD-10-CM | POA: Insufficient documentation

## 2011-01-22 LAB — URINALYSIS, ROUTINE W REFLEX MICROSCOPIC
Glucose, UA: NEGATIVE
pH: 7

## 2011-01-30 LAB — POCT RAPID STREP A: Streptococcus, Group A Screen (Direct): NEGATIVE

## 2011-04-26 ENCOUNTER — Encounter (HOSPITAL_COMMUNITY): Payer: Self-pay | Admitting: Emergency Medicine

## 2011-04-26 ENCOUNTER — Emergency Department (HOSPITAL_COMMUNITY): Payer: Self-pay

## 2011-04-26 ENCOUNTER — Emergency Department (HOSPITAL_COMMUNITY)
Admission: EM | Admit: 2011-04-26 | Discharge: 2011-04-26 | Disposition: A | Discharge status: Home / Self Care | Payer: Self-pay | Attending: Emergency Medicine | Admitting: Emergency Medicine

## 2011-04-26 DIAGNOSIS — R6883 Chills (without fever): Secondary | ICD-10-CM | POA: Insufficient documentation

## 2011-04-26 DIAGNOSIS — R0602 Shortness of breath: Secondary | ICD-10-CM | POA: Insufficient documentation

## 2011-04-26 DIAGNOSIS — R0982 Postnasal drip: Secondary | ICD-10-CM | POA: Insufficient documentation

## 2011-04-26 DIAGNOSIS — J3489 Other specified disorders of nose and nasal sinuses: Secondary | ICD-10-CM | POA: Insufficient documentation

## 2011-04-26 DIAGNOSIS — R059 Cough, unspecified: Secondary | ICD-10-CM | POA: Insufficient documentation

## 2011-04-26 DIAGNOSIS — H9209 Otalgia, unspecified ear: Secondary | ICD-10-CM | POA: Insufficient documentation

## 2011-04-26 DIAGNOSIS — R070 Pain in throat: Secondary | ICD-10-CM | POA: Insufficient documentation

## 2011-04-26 DIAGNOSIS — J45909 Unspecified asthma, uncomplicated: Secondary | ICD-10-CM | POA: Insufficient documentation

## 2011-04-26 DIAGNOSIS — F3289 Other specified depressive episodes: Secondary | ICD-10-CM | POA: Insufficient documentation

## 2011-04-26 DIAGNOSIS — F329 Major depressive disorder, single episode, unspecified: Secondary | ICD-10-CM | POA: Insufficient documentation

## 2011-04-26 DIAGNOSIS — R51 Headache: Secondary | ICD-10-CM | POA: Insufficient documentation

## 2011-04-26 DIAGNOSIS — R05 Cough: Secondary | ICD-10-CM | POA: Insufficient documentation

## 2011-04-26 DIAGNOSIS — J189 Pneumonia, unspecified organism: Secondary | ICD-10-CM | POA: Insufficient documentation

## 2011-04-26 MED ORDER — LEVOFLOXACIN 750 MG PO TABS: 750.0000 mg | ORAL_TABLET | Freq: Every day | ORAL | Status: AC

## 2011-04-26 MED ORDER — ALBUTEROL SULFATE HFA 108 (90 BASE) MCG/ACT IN AERS
2.0000 | INHALATION_SPRAY | RESPIRATORY_TRACT | Status: DC | PRN
  Administered 2011-04-26: 2 via RESPIRATORY_TRACT
  Filled 2011-04-26: qty 6.7

## 2011-04-26 MED ORDER — AZITHROMYCIN 250 MG PO TABS: ORAL_TABLET | ORAL | Status: DC

## 2011-04-26 NOTE — ED Notes (Signed)
Pt sts she is out of her albuterol inhaler

## 2011-04-26 NOTE — ED Notes (Signed)
Pt c/o sob x1 wk, productive cough w/yellow/brown colored sputum, and rib pain d/t coughing. Reports taking Nyquil x3 days w/o any relief

## 2011-04-26 NOTE — ED Provider Notes (Signed)
History     CSN: [id number]  Arrival date & time 04/26/11  1191   First MD Initiated Contact with Patient 04/26/11 [phone]      Chief Complaint  Patient presents with  . Shortness of Breath    (Consider location/radiation/quality/duration/timing/severity/associated sxs/prior treatment) Patient is a 36 y.o. female presenting with shortness of breath. The history is provided by the patient.  Shortness of Breath  The current episode started 3 to 5 days ago. The onset was gradual. The problem occurs continuously. The problem has been unchanged. The problem is moderate. The symptoms are relieved by nothing (Pt ran outof her albuterol inhaler). The symptoms are aggravated by nothing. Associated symptoms include rhinorrhea, sore throat, cough, shortness of breath and wheezing. Pertinent negatives include no chest pain, no chest pressure, no orthopnea and no fever. Her past medical history is significant for asthma. She has received no recent medical care.  URI sx x 7 days with SOBx 3 days. Taking OTC meds with minimal relief.  Past Medical History  Diagnosis Date  . Asthma   . H/O: drug dependency   . Narcotic addiction   . Depression     Past Surgical History  Procedure Date  . Ectopic pregnancy surgery   . Tooth extraction     No family history on file.  History  Substance Use Topics  . Smoking status: Current Everyday Smoker  . Smokeless tobacco: Not on file  . Alcohol Use: Yes    Review of Systems  Constitutional: Positive for chills. Negative for fever.  HENT: Positive for ear pain, congestion, sore throat, rhinorrhea, postnasal drip and sinus pressure. Negative for nosebleeds, trouble swallowing, neck pain, neck stiffness, voice change and ear discharge.   Eyes: Negative for pain and visual disturbance.  Respiratory: Positive for cough, shortness of breath and wheezing. Negative for chest tightness.   Cardiovascular: Negative for chest pain, orthopnea and leg swelling.    Gastrointestinal: Negative for nausea, vomiting and abdominal pain.  Genitourinary: Negative for dysuria and hematuria.  Musculoskeletal: Negative for back pain and gait problem.  Skin: Negative for color change and rash.  Neurological: Negative for syncope, weakness and headaches.  Psychiatric/Behavioral: Negative for confusion.    Allergies  Penicillins and Sulfonamide derivatives  Home Medications   Current Outpatient Rx  Name Route Sig Dispense Refill  . ADALIMUMAB 40 MG/0.8ML [place] KIT Subcutaneous Inject 40 mg into the skin every 14 (fourteen) days.      Marland Kitchen BUPRENORPHINE HCL-NALOXONE HCL 4-1 MG SL FILM Sublingual Place 1 Film under the tongue daily.      Marland Kitchen FLUOXETINE HCL 40 MG PO CAPS Oral Take 80 mg by mouth daily.        BP 119/53  Pulse 78  Temp(Src) 98 F (36.7 C) (Oral)  Resp 18  SpO2 95%  LMP 04/18/2011  Physical Exam  Nursing note and vitals reviewed. Constitutional: She is oriented to person, place, and time. She appears well-developed and well-nourished.       Uncomfortable appearing  HENT:  Head: Normocephalic and atraumatic. No trismus in the jaw.  Right Ear: Tympanic membrane, external ear and ear canal normal.  Left Ear: Tympanic membrane, external ear and ear canal normal.  Nose: Rhinorrhea present.  Mouth/Throat: Uvula is midline and mucous membranes are normal. No uvula swelling. Posterior oropharyngeal erythema present. No oropharyngeal exudate or posterior oropharyngeal edema.  Eyes: Conjunctivae are normal. Pupils are equal, round, and reactive to light. Right eye exhibits no discharge. Left eye exhibits no  discharge.  Neck: Normal range of motion. Neck supple.  Cardiovascular: Normal rate, regular rhythm and normal heart sounds.   Pulmonary/Chest: Effort normal and breath sounds normal. No stridor. She has no wheezes. She exhibits no tenderness.  Abdominal: Soft. She exhibits no distension. There is no tenderness.  Musculoskeletal: She exhibits no  edema and no tenderness.  Lymphadenopathy:    She has no cervical adenopathy.  Neurological: She is alert and oriented to person, place, and time.  Skin: Skin is warm and dry. No rash noted.  Psychiatric: Her behavior is normal.    ED Course  Procedures (including critical care time)  Labs Reviewed - No data to display Dg Chest 2 View  04/26/2011  *RADIOLOGY REPORT*  Clinical Data: Cough, congestion, chills, shortness of breath  CHEST - 2 VIEW  Comparison: July 22, 2010  Findings: Patchy airspace opacity is now present within the left midlung.  The right lung is clear.  The cardiac silhouette, mediastinum, pulmonary vasculature are within normal limits.  IMPRESSION: New left mid lung pneumonia.  Original Report Authenticated By: Brandon Melnick, M.D.     Dx 1: community acquired pneumonia   MDM  X-ray reviewed, left sided pneumonia. No wheezing on exam to suggest asthma exacerbation. Good candidate for outpatient treatment. No recent hospitalization, we'll treat for CAP.        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  [address] [place], [place] 04/26/11 [phone]

## 2011-04-26 NOTE — ED Provider Notes (Signed)
Medical screening examination/treatment/procedure(s) were performed by non-physician practitioner and as supervising physician I was immediately available for consultation/collaboration.   Gwyneth Sprout, MD 04/26/11 1338

## 2011-04-26 NOTE — ED Notes (Signed)
PT. REPORTS SOB WITH PRODUCTIVE COUGH AND CHEST CONGESTION , CHILLS , SWEATS AND BILATERAL EAR ACHES FOR 3 DAYS.

## 2011-05-17 ENCOUNTER — Emergency Department (HOSPITAL_COMMUNITY)
Admission: EM | Admit: 2011-05-17 | Discharge: 2011-05-17 | Disposition: A | Discharge status: Home / Self Care | Payer: Self-pay | Attending: Emergency Medicine | Admitting: Emergency Medicine

## 2011-05-17 ENCOUNTER — Encounter (HOSPITAL_COMMUNITY): Payer: Self-pay | Admitting: *Deleted

## 2011-05-17 DIAGNOSIS — F3289 Other specified depressive episodes: Secondary | ICD-10-CM | POA: Insufficient documentation

## 2011-05-17 DIAGNOSIS — J45909 Unspecified asthma, uncomplicated: Secondary | ICD-10-CM | POA: Insufficient documentation

## 2011-05-17 DIAGNOSIS — R110 Nausea: Secondary | ICD-10-CM | POA: Insufficient documentation

## 2011-05-17 DIAGNOSIS — Z79899 Other long term (current) drug therapy: Secondary | ICD-10-CM | POA: Insufficient documentation

## 2011-05-17 DIAGNOSIS — F329 Major depressive disorder, single episode, unspecified: Secondary | ICD-10-CM | POA: Insufficient documentation

## 2011-05-17 DIAGNOSIS — G43909 Migraine, unspecified, not intractable, without status migrainosus: Secondary | ICD-10-CM | POA: Insufficient documentation

## 2011-05-17 DIAGNOSIS — H53149 Visual discomfort, unspecified: Secondary | ICD-10-CM | POA: Insufficient documentation

## 2011-05-17 MED ORDER — SUMATRIPTAN SUCCINATE 6 MG/0.5ML ~~LOC~~ SOLN
6.0000 mg | Freq: Once | SUBCUTANEOUS | Status: AC
  Administered 2011-05-17: 6 mg via SUBCUTANEOUS
  Filled 2011-05-17: qty 0.5

## 2011-05-17 MED ORDER — SUMATRIPTAN SUCCINATE 100 MG PO TABS: 100.0000 mg | ORAL_TABLET | ORAL | Status: DC | PRN

## 2011-05-17 NOTE — ED Notes (Signed)
The pt has had a migraine headache since yesterday.  She ran out of her imitrex and she could not get her doctor to call in another rx.  No nv

## 2011-05-17 NOTE — ED Provider Notes (Signed)
History     CSN: [id number]  Arrival date & time 05/17/11  1901   First MD Initiated Contact with Patient 05/17/11 2107      Chief Complaint  Patient presents with  . Migraine    (Consider location/radiation/quality/duration/timing/severity/associated sxs/prior treatment) HPI Comments: Patient reports that she began having a migraine headache earlier today.  She reports that this headache is no different than headaches that she has had in the past.  She reports that the pain is located on her left forehead.  She reports that she usually takes imitrex for her migraines, which helps.  She had a prescription for Imitrex from her PCP but recently ran out.  She called the office of her PCP today to get more medication, but the doctor on call refused to call her in medication.  She tried taking ibuprofen prior to arrival in the ED, but reports that it did not help.  Patient is a 36 y.o. female presenting with migraine.  Migraine The problem has been unchanged. Associated symptoms include headaches and nausea. Pertinent negatives include no chills, fever, neck pain, numbness, rash, visual change, vomiting or weakness.    Past Medical History  Diagnosis Date  . Asthma   . H/O: drug dependency   . Narcotic addiction   . Depression   . Migraine     Past Surgical History  Procedure Date  . Ectopic pregnancy surgery   . Tooth extraction     History reviewed. No pertinent family history.  History  Substance Use Topics  . Smoking status: Current Everyday Smoker  . Smokeless tobacco: Not on file  . Alcohol Use: Yes    OB History    Grav Para Term Preterm Abortions TAB SAB Ect Mult Living                  Review of Systems  Constitutional: Negative for fever and chills.  HENT: Negative for neck pain and neck stiffness.   Eyes: Positive for photophobia. Negative for pain, discharge, redness and visual disturbance.  Respiratory: Negative for shortness of breath.     Gastrointestinal: Positive for nausea. Negative for vomiting.  Musculoskeletal: Negative for gait problem.  Skin: Negative for rash.  Neurological: Positive for headaches. Negative for dizziness, syncope, weakness and numbness.  Psychiatric/Behavioral: Negative for confusion.    Allergies  Penicillins and Sulfonamide derivatives  Home Medications   Current Outpatient Rx  Name Route Sig Dispense Refill  . ADALIMUMAB 40 MG/0.8ML [place] KIT Subcutaneous Inject 40 mg into the skin every 14 (fourteen) days.      Marland Kitchen BUPRENORPHINE HCL-NALOXONE HCL 4-1 MG SL FILM Sublingual Place 1 Film under the tongue daily.      Marland Kitchen FLUOXETINE HCL 40 MG PO CAPS Oral Take 80 mg by mouth daily.      . IBUPROFEN 800 MG PO TABS Oral Take 800 mg by mouth once.      BP 130/75  Pulse 86  Temp(Src) 97.7 F (36.5 C) (Oral)  Resp 18  SpO2 96%  LMP 04/18/2011  Physical Exam  Nursing note and vitals reviewed. Constitutional: She is oriented to person, place, and time. She appears well-developed and well-nourished. No distress.  HENT:  Head: Normocephalic and atraumatic.  Eyes: EOM are normal. Pupils are equal, round, and reactive to light.  Neck: Normal range of motion. Neck supple.  Cardiovascular: Normal rate, regular rhythm and normal heart sounds.   Pulmonary/Chest: Effort normal and breath sounds normal. No respiratory distress. She has no wheezes.  Neurological: She is alert and oriented to person, place, and time. She has normal strength. No cranial nerve deficit or sensory deficit. Coordination and gait normal.  Skin: Skin is warm and dry. No rash noted. She is not diaphoretic.  Psychiatric: She has a normal mood and affect.    ED Course  Procedures (including critical care time)  Labs Reviewed - No data to display No results found.   No diagnosis found.  10:08 PM Reassessed patient.  She reports that her pain has completely resolved.  MDM  Patient with history of migraine headaches comes in  today with her typical migraine headache.  She reports that headache is no different today than her typical migraine.  VSS.  Afebrile.  Neurological exam normal.  Pain given dose of Imitrex in the ED and headache completely resolved.          Pascal Lux [place], PA-C 05/18/11 [phone]

## 2011-05-17 NOTE — ED Notes (Signed)
Patient with migraine headache.  Has history of same and ran out of medication

## 2011-05-18 NOTE — ED Provider Notes (Signed)
Medical screening examination/treatment/procedure(s) were performed by non-physician practitioner and as supervising physician I was immediately available for consultation/collaboration.  Doug Sou, MD 05/18/11 0040

## 2011-11-26 ENCOUNTER — Telehealth: Payer: Self-pay | Admitting: Family Medicine

## 2011-11-26 NOTE — Telephone Encounter (Signed)
Pt called and said that she was a pt of Dr Carver Fila a couple of years ago, but then she had Medicaid and could no longer see Dr Clent Ridges. Pt has been self pay now and will be getting new insurance through her work. Pt is req to re-est with Dr Clent Ridges. Pls advise.

## 2011-11-26 NOTE — Telephone Encounter (Signed)
NO she would need to establish with someone else, thanks

## 2011-11-27 NOTE — Telephone Encounter (Signed)
Called and informed pt that Dr Clent Ridges has declined req to re-est and recommended pt to est with another provider as noted. Pt said that she made an appt with another physician elsewhere.

## 2011-12-08 ENCOUNTER — Emergency Department (HOSPITAL_COMMUNITY)
Admission: EM | Admit: 2011-12-08 | Discharge: 2011-12-08 | Disposition: A | Discharge status: Home / Self Care | Payer: Self-pay | Attending: Emergency Medicine | Admitting: Emergency Medicine

## 2011-12-08 ENCOUNTER — Encounter (HOSPITAL_COMMUNITY): Payer: Self-pay | Admitting: *Deleted

## 2011-12-08 DIAGNOSIS — R51 Headache: Secondary | ICD-10-CM | POA: Insufficient documentation

## 2011-12-08 DIAGNOSIS — F17200 Nicotine dependence, unspecified, uncomplicated: Secondary | ICD-10-CM | POA: Insufficient documentation

## 2011-12-08 MED ORDER — CLONAZEPAM 0.5 MG PO TABS: 0.5000 mg | ORAL_TABLET | Freq: Two times a day (BID) | ORAL | Status: DC | PRN

## 2011-12-08 MED ORDER — DEXAMETHASONE SODIUM PHOSPHATE 10 MG/ML IJ SOLN
10.0000 mg | Freq: Once | INTRAMUSCULAR | Status: AC
  Administered 2011-12-08: 10 mg via INTRAMUSCULAR
  Filled 2011-12-08: qty 1

## 2011-12-08 MED ORDER — KETOROLAC TROMETHAMINE 30 MG/ML IJ SOLN
30.0000 mg | Freq: Once | INTRAMUSCULAR | Status: AC
  Administered 2011-12-08: 30 mg via INTRAMUSCULAR
  Filled 2011-12-08: qty 1

## 2011-12-08 MED ORDER — METOCLOPRAMIDE HCL 5 MG/ML IJ SOLN
10.0000 mg | Freq: Once | INTRAMUSCULAR | Status: AC
  Administered 2011-12-08: 10 mg via INTRAMUSCULAR
  Filled 2011-12-08: qty 2

## 2011-12-08 MED ORDER — DIPHENHYDRAMINE HCL 25 MG PO CAPS
25.0000 mg | ORAL_CAPSULE | Freq: Once | ORAL | Status: AC
  Administered 2011-12-08: 25 mg via ORAL
  Filled 2011-12-08: qty 1

## 2011-12-08 NOTE — ED Provider Notes (Signed)
Medical screening examination/treatment/procedure(s) were performed by non-physician practitioner and as supervising physician I was immediately available for consultation/collaboration.  Olivia Mackie, MD 12/08/11 [phone]

## 2011-12-08 NOTE — ED Provider Notes (Signed)
History     CSN: [id number]  Arrival date & time 12/08/11  0401   First MD Initiated Contact with Patient 12/08/11 [phone]      Chief Complaint  Patient presents with  . Migraine   HPI  History provided by the patient. 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 Patient states that headache began over the past 6 hours prior to arrival. She has pain in the right frontal for head area and right posterior scalp. This is a common location for her migraine-type headaches. Patient states that she believes her headache was caused from an increased sodium meal at dinnertime. She reports taking her daughter to a [place]. Patient also states that she missed her evening dose of Suboxone she normally takes her narcotic withdrawal. Patient states she is not think her headache is caused from this in the Suboxone as is normally causes lower extremity cramping and discomfort or abdominal cramps. Patient also has some complaints of nausea and thirst but has no other associated symptoms. She did not try taking any medications for her headache. She denies any associated fever, vomiting, numbness or weakness in extremities.    Past Medical History  Diagnosis Date  . Asthma   . H/O: drug dependency   . Narcotic addiction   . Depression   . Migraine     Past Surgical History  Procedure Date  . Ectopic pregnancy surgery   . Tooth extraction     No family history on file.  History  Substance Use Topics  . Smoking status: Current Everyday Smoker  . Smokeless tobacco: Not on file  . Alcohol Use: Yes    OB History    Grav Para Term Preterm Abortions TAB SAB Ect Mult Living                  Review of Systems  Constitutional: Negative for chills.  Eyes: Negative for photophobia.  Gastrointestinal: Positive for nausea. Negative for vomiting,  abdominal pain, diarrhea and constipation.  Neurological: Positive for headaches. Negative for light-headedness.    Allergies  Penicillins and Sulfonamide derivatives  Home Medications   Current Outpatient Rx  Name Route Sig Dispense Refill  . ADALIMUMAB 40 MG/0.8ML [place] KIT Subcutaneous Inject 40 mg into the skin every 14 (fourteen) days.      Marland Kitchen BUPRENORPHINE HCL-NALOXONE HCL 4-1 MG SL FILM Sublingual Place 0.5 Film under the tongue daily.     Marland Kitchen FLUOXETINE HCL 40 MG PO CAPS Oral Take 80 mg by mouth daily.      . SUMATRIPTAN SUCCINATE 100 MG PO TABS Oral Take 1 tablet (100 mg total) by mouth every 2 (two) hours as needed for migraine. 6 tablet 0    BP 121/58  Pulse 76  Temp 98 F (36.7 C) (Oral)  Resp 20  SpO2 100%  LMP 11/06/2011  Physical Exam  Nursing note and vitals reviewed. Constitutional: She is oriented to person, place, and time. She appears well-developed and well-nourished. No distress.  HENT:  Head: Normocephalic and atraumatic.  Eyes: Conjunctivae and EOM are normal. Pupils are equal, round, and reactive to light.  Neck: Normal range of motion. Neck supple.       No meningeal signs  Cardiovascular: Normal rate and regular rhythm.   Pulmonary/Chest: Effort normal and breath sounds normal. No respiratory distress. She has no wheezes. She has no rales.  Abdominal: Soft.  There is no tenderness. There is no rebound and no guarding.  Neurological: She is alert and oriented to person, place, and time. She has normal strength. No cranial nerve deficit or sensory deficit. Gait normal.  Skin: Skin is warm and dry.  Psychiatric: She has a normal mood and affect. Her behavior is normal.    ED Course  Procedures       1. Headache       MDM  Patient seen and evaluated. Patient in no acute distress. Patient with normal nonfocal neuro exam no concerning symptoms for headache.   Migraine headache cocktail ordered. Patient reports having good followup her  psychiatrist in [place] later today at 1:30 she receives Suboxone treatment from.   Patient reports the headache has improved significantly. Will discharge at this time.  Phill Mutter [place], [place] 12/08/11 [phone]

## 2011-12-08 NOTE — ED Notes (Signed)
Pt c/o migraine headache x 6 hours; states missed last nights dose of suboxone; nauseated

## 2012-07-15 ENCOUNTER — Emergency Department (HOSPITAL_COMMUNITY)
Admission: EM | Admit: 2012-07-15 | Discharge: 2012-07-15 | Disposition: A | Discharge status: Home / Self Care | Payer: Self-pay | Attending: Emergency Medicine | Admitting: Emergency Medicine

## 2012-07-15 ENCOUNTER — Encounter (HOSPITAL_COMMUNITY): Payer: Self-pay | Admitting: Emergency Medicine

## 2012-07-15 DIAGNOSIS — F329 Major depressive disorder, single episode, unspecified: Secondary | ICD-10-CM | POA: Insufficient documentation

## 2012-07-15 DIAGNOSIS — J45909 Unspecified asthma, uncomplicated: Secondary | ICD-10-CM | POA: Insufficient documentation

## 2012-07-15 DIAGNOSIS — F17200 Nicotine dependence, unspecified, uncomplicated: Secondary | ICD-10-CM | POA: Insufficient documentation

## 2012-07-15 DIAGNOSIS — R110 Nausea: Secondary | ICD-10-CM | POA: Insufficient documentation

## 2012-07-15 DIAGNOSIS — F3289 Other specified depressive episodes: Secondary | ICD-10-CM | POA: Insufficient documentation

## 2012-07-15 DIAGNOSIS — G43909 Migraine, unspecified, not intractable, without status migrainosus: Secondary | ICD-10-CM | POA: Insufficient documentation

## 2012-07-15 DIAGNOSIS — H53149 Visual discomfort, unspecified: Secondary | ICD-10-CM | POA: Insufficient documentation

## 2012-07-15 DIAGNOSIS — Z79899 Other long term (current) drug therapy: Secondary | ICD-10-CM | POA: Insufficient documentation

## 2012-07-15 MED ORDER — SUMATRIPTAN SUCCINATE 6 MG/0.5ML ~~LOC~~ SOLN
6.0000 mg | Freq: Once | SUBCUTANEOUS | Status: AC
  Administered 2012-07-15: 6 mg via SUBCUTANEOUS
  Filled 2012-07-15: qty 0.5

## 2012-07-15 MED ORDER — ONDANSETRON 8 MG PO TBDP
8.0000 mg | ORAL_TABLET | Freq: Once | ORAL | Status: AC
Start: 2012-07-15 — End: 2012-07-15
  Administered 2012-07-15: 8 mg via ORAL
  Filled 2012-07-15: qty 1

## 2012-07-15 NOTE — ED Provider Notes (Signed)
History    This chart was scribed for non-physician practitioner working with Ward Givens, MD by Leone Payor, ED Scribe. This patient was seen in room WTR8/WTR8 and the patient's care was started at 2025.   CSN: [id number]  Arrival date & time 07/15/12  2025   None     Chief Complaint  Patient presents with  . Migraine     The history is provided by the patient. No language interpreter was used.    Alexandra Holt is a 37 y.o. female who presents to the Emergency Department complaining of a new, constant, unchanged migraine headache to the right side of head and right eye that started last night. Pt describes the pain as throbbing. She took an 800 mg ibuprofen today with no relief. She has associated visual disturbances and nausea. She denies vomiting with this episode. Pt has h/o migraines. This is her usual pain. There are no unusual symptoms with this migraine. Imitrex works well for her. She is currently out of this medication. She has photophobia and phonophobia. Nothing makes the pain better.  Pt is a current everyday smoker and occasional alcohol user.  Past Medical History  Diagnosis Date  . Asthma   . H/O: drug dependency   . Narcotic addiction   . Depression   . Migraine     Past Surgical History  Procedure Laterality Date  . Ectopic pregnancy surgery    . Tooth extraction      Family History  Problem Relation Age of Onset  . Diabetes Other   . Hypertension Other     History  Substance Use Topics  . Smoking status: Current Every Day Smoker    Types: Cigarettes  . Smokeless tobacco: Not on file  . Alcohol Use: Yes     Comment: occ    OB History   Grav Para Term Preterm Abortions TAB SAB Ect Mult Living                  Review of Systems  Constitutional: Negative for fever.  HENT: Negative for congestion, rhinorrhea, neck pain, neck stiffness, dental problem and sinus pressure.   Eyes: Negative for photophobia, discharge, redness and visual  disturbance.  Respiratory: Negative for shortness of breath.   Cardiovascular: Negative for chest pain.  Gastrointestinal: Positive for nausea. Negative for vomiting.  Musculoskeletal: Negative for gait problem.  Skin: Negative for rash.  Neurological: Positive for headaches. Negative for syncope, speech difficulty, weakness, light-headedness and numbness.  Psychiatric/Behavioral: Negative for confusion.    Allergies  Penicillins and Sulfonamide derivatives  Home Medications   Current Outpatient Rx  Name  Route  Sig  Dispense  Refill  . adalimumab (HUMIRA) 40 MG/0.8ML injection   Subcutaneous   Inject 40 mg into the skin every 14 (fourteen) days.           . Buprenorphine HCl-Naloxone HCl (SUBOXONE) 4-1 MG FILM   Sublingual   Place 0.5 Film under the tongue daily.          Marland Kitchen EXPIRED: clonazePAM (KLONOPIN) 0.5 MG tablet   Oral   Take 1 tablet (0.5 mg total) by mouth 2 (two) times daily as needed for anxiety.   1 tablet   0   . FLUoxetine (PROZAC) 40 MG capsule   Oral   Take 80 mg by mouth daily.           Marland Kitchen EXPIRED: SUMAtriptan (IMITREX) 100 MG tablet   Oral   Take 1 tablet (100  mg total) by mouth every 2 (two) hours as needed for migraine.   6 tablet   0     BP 139/64  Pulse 63  Temp(Src) 97.7 F (36.5 C) (Oral)  Resp 18  SpO2 100%  LMP 07/04/2012  Physical Exam  Nursing note and vitals reviewed. Constitutional: She is oriented to person, place, and time. She appears well-developed and well-nourished. No distress.  HENT:  Head: Normocephalic and atraumatic.  Right Ear: Tympanic membrane, external ear and ear canal normal.  Left Ear: Tympanic membrane, external ear and ear canal normal.  Nose: Nose normal.  Mouth/Throat: Uvula is midline, oropharynx is clear and moist and mucous membranes are normal.  Eyes: Conjunctivae, EOM and lids are normal. Pupils are equal, round, and reactive to light. Right eye exhibits no nystagmus. Left eye exhibits no  nystagmus.  Neck: Normal range of motion. Neck supple. No tracheal deviation present.  No meningismus.  Cardiovascular: Normal rate and regular rhythm.   Pulmonary/Chest: Effort normal and breath sounds normal. No respiratory distress.  Abdominal: Soft. There is no tenderness.  Musculoskeletal: Normal range of motion.       Cervical back: She exhibits normal range of motion, no tenderness and no bony tenderness.  Lymphadenopathy:    She has no cervical adenopathy.  Neurological: She is alert and oriented to person, place, and time. She has normal strength and normal reflexes. No cranial nerve deficit or sensory deficit. She displays a negative Romberg sign. Coordination and gait normal. GCS eye subscore is 4. GCS verbal subscore is 5. GCS motor subscore is 6.  Skin: Skin is warm and dry.  Psychiatric: She has a normal mood and affect. Her behavior is normal.    ED Course  Procedures (including critical care time)  DIAGNOSTIC STUDIES: Oxygen Saturation is 100% on room air, normal by my interpretation.    COORDINATION OF CARE: 8:46 PM Discussed treatment plan with pt at bedside and pt agreed to plan.    Labs Reviewed - No data to display No results found.   1. Migraine    Patient seen and examined. Medications ordered.   Vital signs reviewed and are as follows: Filed Vitals:   07/15/12 2030  BP: 139/64  Pulse: 63  Temp: 97.7 F (36.5 C)  Resp: 18   Patient given Imitrex subcutaneous and Zofran.  Patient requests discharge home so that she can sleep. She will followup with her primary care physician for refills of her Imitrex.    MDM  Patient with typical migraine headache and normal neurological exam. No head trauma. No meningismus or signs of meningitis. Do not suspect intracranial bleed. Patient appears well, nontoxic.      I personally performed the services described in this documentation, which was scribed in my presence. The recorded information has been  reviewed and is accurate.   Renne Crigler, PA-C 07/16/12 [phone]

## 2012-07-15 NOTE — ED Notes (Signed)
Pt states she is having a migraine headache that started about 0430 this morning  Pt states she usually uses imitrex but she is currently out  Pt has nausea without vomiting

## 2012-07-18 NOTE — ED Provider Notes (Signed)
Medical screening examination/treatment/procedure(s) were performed by non-physician practitioner and as supervising physician I was immediately available for consultation/collaboration. Kaileah Shevchenko, MD, FACEP   Deyonna Fitzsimmons L Asser [place], MD 07/18/12 0858 

## 2012-08-31 ENCOUNTER — Emergency Department (HOSPITAL_COMMUNITY): Payer: Self-pay

## 2012-08-31 ENCOUNTER — Emergency Department (HOSPITAL_COMMUNITY)
Admission: EM | Admit: 2012-08-31 | Discharge: 2012-08-31 | Disposition: A | Discharge status: Home / Self Care | Payer: Self-pay | Attending: Emergency Medicine | Admitting: Emergency Medicine

## 2012-08-31 ENCOUNTER — Encounter (HOSPITAL_COMMUNITY): Payer: Self-pay | Admitting: Emergency Medicine

## 2012-08-31 DIAGNOSIS — Z8679 Personal history of other diseases of the circulatory system: Secondary | ICD-10-CM | POA: Insufficient documentation

## 2012-08-31 DIAGNOSIS — J45909 Unspecified asthma, uncomplicated: Secondary | ICD-10-CM | POA: Insufficient documentation

## 2012-08-31 DIAGNOSIS — J069 Acute upper respiratory infection, unspecified: Secondary | ICD-10-CM | POA: Insufficient documentation

## 2012-08-31 DIAGNOSIS — F17200 Nicotine dependence, unspecified, uncomplicated: Secondary | ICD-10-CM | POA: Insufficient documentation

## 2012-08-31 DIAGNOSIS — IMO0001 Reserved for inherently not codable concepts without codable children: Secondary | ICD-10-CM | POA: Insufficient documentation

## 2012-08-31 DIAGNOSIS — F1110 Opioid abuse, uncomplicated: Secondary | ICD-10-CM | POA: Insufficient documentation

## 2012-08-31 DIAGNOSIS — R059 Cough, unspecified: Secondary | ICD-10-CM

## 2012-08-31 DIAGNOSIS — Z79899 Other long term (current) drug therapy: Secondary | ICD-10-CM | POA: Insufficient documentation

## 2012-08-31 DIAGNOSIS — F329 Major depressive disorder, single episode, unspecified: Secondary | ICD-10-CM | POA: Insufficient documentation

## 2012-08-31 DIAGNOSIS — R05 Cough: Secondary | ICD-10-CM

## 2012-08-31 DIAGNOSIS — F3289 Other specified depressive episodes: Secondary | ICD-10-CM | POA: Insufficient documentation

## 2012-08-31 MED ORDER — ALBUTEROL SULFATE HFA 108 (90 BASE) MCG/ACT IN AERS
2.0000 | INHALATION_SPRAY | RESPIRATORY_TRACT | Status: DC | PRN
  Administered 2012-08-31: 2 via RESPIRATORY_TRACT
  Filled 2012-08-31: qty 6.7

## 2012-08-31 NOTE — ED Notes (Signed)
Pt here with non productive cough x 10 days; pt sts some fever at times

## 2012-08-31 NOTE — ED Provider Notes (Signed)
History  This chart was scribed for non-physician practitioner Junius Finner, PA-C working with Loren Racer, MD, by Candelaria Stagers, ED Scribe. This patient was seen in room TR04C/TR04C and the patient's care was started at 6:25 PM   CSN: [id number]  Arrival date & time 08/31/12  1650   First MD Initiated Contact with Patient 08/31/12 1806      Chief Complaint  Patient presents with  . Cough    The history is provided by the patient. No language interpreter was used.   HPI Comments: AIRIEL Holt is a 37 y.o. female who presents to the Emergency Department complaining of a non productive cough that started about ten days ago.  She is also experiencing subjective fever and generalized body aches.  Pt denies sore throat or ear pain.  She has taken OTC cough medicine with no relief of cough.  She used an inhaler with relief, but has run out of this.  Pt has also taken Advil for the body aches with mild relief.  Pt smokes.  She denies h/o asthma.  Pt reports she has h/o recurrent pneumonia.  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  Denies n/v/d.    Past Medical History  Diagnosis Date  . Asthma   . H/O: drug dependency   . Narcotic addiction   . Depression   . Migraine     Past Surgical History  Procedure Laterality Date  . Ectopic pregnancy surgery    . Tooth extraction      Family History  Problem Relation Age of Onset  . Diabetes Other   . Hypertension Other     History  Substance Use Topics  . Smoking status: Current Every Day Smoker    Types: Cigarettes  . Smokeless tobacco: Not on file  . Alcohol Use: Yes     Comment: occ    OB History   Grav Para Term Preterm Abortions TAB SAB Ect Mult Living                  Review of Systems  Constitutional:       Generalized body aches.   Respiratory: Positive for cough (nonproductive cough).   All other systems reviewed and are negative.    Allergies   Penicillins and Sulfonamide derivatives  Home Medications   Current Outpatient Rx  Name  Route  Sig  Dispense  Refill  . adalimumab (HUMIRA) 40 MG/0.8ML injection   Subcutaneous   Inject 40 mg into the skin every 14 (fourteen) days.          Marland Kitchen albuterol (PROVENTIL HFA;VENTOLIN HFA) 108 (90 BASE) MCG/ACT inhaler   Inhalation   Inhale 2 puffs into the lungs every 6 (six) hours as needed for wheezing or shortness of breath.         . Buprenorphine HCl-Naloxone HCl (SUBOXONE) 4-1 MG FILM   Sublingual   Place 1 Film under the tongue daily.          Marland Kitchen Dextromethorphan HBr (COUGH RELIEF PO)   Oral   Take 10 mLs by mouth every 4 (four) hours as needed (cough).         Marland Kitchen FLUoxetine (PROZAC) 40 MG capsule   Oral   Take 40 mg by mouth daily.          Marland Kitchen ibuprofen (ADVIL,MOTRIN) 800 MG tablet   Oral   Take 800 mg by mouth every 8 (eight) hours as needed for  pain.           BP 159/70  Temp(Src) 98.1 F (36.7 C) (Oral)  SpO2 97%  LMP 08/01/2012  Physical Exam  Nursing note and vitals reviewed. Constitutional: She is oriented to person, place, and time. She appears well-developed and well-nourished. No distress.  Pt sitting in exam room, NAD.  HENT:  Head: Normocephalic and atraumatic.  Mouth/Throat: Oropharynx is clear and moist. No oropharyngeal exudate.  Eyes: EOM are normal.  Neck: Normal range of motion. Neck supple. No tracheal deviation present.  Cardiovascular: Normal rate.   Pulmonary/Chest: Effort normal and breath sounds normal. No respiratory distress. She has no wheezes. She has no rales. She exhibits no tenderness.    Pt able to speak in full paragraphs. No dyspnea. No wheezing or rhonchi.  Abdominal: Soft. Bowel sounds are normal. She exhibits no distension. There is no tenderness.  Musculoskeletal: Normal range of motion.  Neurological: She is alert and oriented to person, place, and time.  Skin: Skin is warm and dry.  Psychiatric: She has a normal  mood and affect. Her behavior is normal.    ED Course  Procedures   DIAGNOSTIC STUDIES: Oxygen Saturation is 97% on room air, normal by my interpretation.    COORDINATION OF CARE:  6:30 PM Discussed course of care with pt which includes inhaler and continued symptomatic treatment.  Pt understands and agrees.    Labs Reviewed - No data to display Dg Chest 2 View  08/31/2012  *RADIOLOGY REPORT*  Clinical Data: Chest pain, shortness of breath, cough.  CHEST - 2 VIEW  Comparison: 04/26/2011  Findings: Heart and mediastinal contours are within normal limits. No focal opacities or effusions.  No acute bony abnormality.  IMPRESSION: No active cardiopulmonary disease.   Original Report Authenticated By: Charlett Nose, M.D.      1. URI, acute   2. Cough       MDM  Pt has used albuterol inhaler at home that she has been trying and has noticed some relief but ran out.   Pt is afebile, 97% RA, no dyspnea or cough during hx.  No wheezes or rhonchi on lung exam. CXR: nl  Not concerned for pneumonia at this time. Rx: albuterol inhaler.  Pt may take acetaminophen and ibuprofen as needed for body aches.  Continue taking otc cough syrup.    I personally performed the services described in this documentation, which was scribed in my presence. The recorded information has been reviewed and is accurate. Vitals: unremarkable. Discharged in stable condition.              Junius Finner, PA-C 08/31/12 2356

## 2012-09-02 NOTE — ED Provider Notes (Signed)
Medical screening examination/treatment/procedure(s) were performed by non-physician practitioner and as supervising physician I was immediately available for consultation/collaboration.   Loren Racer, MD 09/02/12 [phone]

## 2012-10-17 ENCOUNTER — Inpatient Hospital Stay (HOSPITAL_COMMUNITY): Payer: Self-pay

## 2012-10-17 ENCOUNTER — Inpatient Hospital Stay (HOSPITAL_COMMUNITY)
Admission: AD | Admit: 2012-10-17 | Discharge: 2012-10-18 | Disposition: A | Discharge status: Home / Self Care | Payer: Self-pay | Source: Ambulatory Visit | Attending: Obstetrics & Gynecology | Admitting: Obstetrics & Gynecology

## 2012-10-17 ENCOUNTER — Encounter (HOSPITAL_COMMUNITY): Payer: Self-pay

## 2012-10-17 DIAGNOSIS — N949 Unspecified condition associated with female genital organs and menstrual cycle: Secondary | ICD-10-CM | POA: Insufficient documentation

## 2012-10-17 DIAGNOSIS — R102 Pelvic and perineal pain: Secondary | ICD-10-CM

## 2012-10-17 DIAGNOSIS — R109 Unspecified abdominal pain: Secondary | ICD-10-CM | POA: Insufficient documentation

## 2012-10-17 DIAGNOSIS — IMO0002 Reserved for concepts with insufficient information to code with codable children: Secondary | ICD-10-CM | POA: Insufficient documentation

## 2012-10-17 LAB — URINALYSIS, ROUTINE W REFLEX MICROSCOPIC
Ketones, ur: NEGATIVE mg/dL
Leukocytes, UA: NEGATIVE
Nitrite: NEGATIVE
Protein, ur: NEGATIVE mg/dL
pH: 6.5 (ref 5.0–8.0)

## 2012-10-17 LAB — POCT PREGNANCY, URINE: Preg Test, Ur: NEGATIVE

## 2012-10-17 MED ORDER — KETOROLAC TROMETHAMINE 60 MG/2ML IM SOLN
60.0000 mg | Freq: Once | INTRAMUSCULAR | Status: AC
  Administered 2012-10-17: 60 mg via INTRAMUSCULAR
  Filled 2012-10-17: qty 2

## 2012-10-17 NOTE — MAU Note (Signed)
Pt states that she just got insurance. She has been in pain for a while, but the last couple of days it has been worse. Pt states that she has not been to the OB since 2009 and she doesn't know where to start.

## 2012-10-17 NOTE — MAU Note (Addendum)
Pt reports that intercourse is very painful. Pt reports that she feels tired all the time and has no energy. She sleeps "all the time and still feels tired". Pt reports having hot flashes and seating a lot. Pt states the "something is off"

## 2012-10-17 NOTE — MAU Provider Note (Signed)
History     CSN: [id number]  Arrival date and time: 10/17/12 2205   First Provider Initiated Contact with Patient 10/17/12 2254      Chief Complaint  Patient presents with  . Abdominal Pain   HPI  Alexandra Holt is a 37 y.o. [id number] who presents today with lower abdominal pain that feels like cramping. She rates it 10/10. She states it has been going on for 2 years. She also reports pain during intercourse X 1 year. She states that she has been sweating and having hot flashes x 2.5 years. She has not been to the GYN since 2009 d/t lack of insurance. She also feels that she has been gaining weight.   Past Medical History  Diagnosis Date  . Asthma   . H/O: drug dependency   . Narcotic addiction   . Depression   . Migraine     Past Surgical History  Procedure Laterality Date  . Ectopic pregnancy surgery    . Tooth extraction      Family History  Problem Relation Age of Onset  . Diabetes Other   . Hypertension Other   . Cancer Maternal Aunt     History  Substance Use Topics  . Smoking status: Current Every Day Smoker -- 1.00 packs/day for 20 years    Types: Cigarettes  . Smokeless tobacco: Not on file  . Alcohol Use: Yes     Comment: occ    Allergies:  Allergies  Allergen Reactions  . Penicillins Other (See Comments)    Unknown reaction  . Sulfonamide Derivatives Hives and Rash    Prescriptions prior to admission  Medication Sig Dispense Refill  . Buprenorphine HCl-Naloxone HCl (SUBOXONE) 4-1 MG FILM Place 1 Film under the tongue daily.       Marland Kitchen ibuprofen (ADVIL,MOTRIN) 800 MG tablet Take 800 mg by mouth every 8 (eight) hours as needed for pain.      Marland Kitchen adalimumab (HUMIRA) 40 MG/0.8ML injection Inject 40 mg into the skin every 14 (fourteen) days.       Marland Kitchen albuterol (PROVENTIL HFA;VENTOLIN HFA) 108 (90 BASE) MCG/ACT inhaler Inhale 2 puffs into the lungs every 6 (six) hours as needed for wheezing or shortness of breath.      . Dextromethorphan HBr (COUGH RELIEF  PO) Take 10 mLs by mouth every 4 (four) hours as needed (cough).      Marland Kitchen FLUoxetine (PROZAC) 40 MG capsule Take 40 mg by mouth daily.         ROS Physical Exam   Blood pressure 156/85, pulse 82, resp. rate 20, height 5\' 11"  (1.803 m), weight 106.414 kg (234 lb 9.6 oz), last menstrual period 10/03/2012, SpO2 100.00%.  Physical Exam  Nursing note and vitals reviewed. Constitutional: She is oriented to person, place, and time. She appears well-developed and well-nourished. No distress.  Cardiovascular: Normal rate.   Respiratory: Effort normal.  GI: Soft.  Genitourinary:  External: no lesion Vagina: small amount of white discharge Cervix: pink, smooth, no CMT Uterus: NSSC Adnexa: NT   Neurological: She is alert and oriented to person, place, and time.  Skin: Skin is warm and dry.  Psychiatric: She has a normal mood and affect.    MAU Course  Procedures  Results for orders placed during the hospital encounter of 10/17/12 (from the past 24 hour(s))  URINALYSIS, ROUTINE W REFLEX MICROSCOPIC     Status: None   Collection Time    10/17/12 10:19 PM      Result  Value Range   Color, Urine YELLOW  YELLOW   APPearance CLEAR  CLEAR   Specific Gravity, Urine 1.010  1.005 - 1.030   pH 6.5  5.0 - 8.0   Glucose, UA NEGATIVE  NEGATIVE mg/dL   Hgb urine dipstick NEGATIVE  NEGATIVE   Bilirubin Urine NEGATIVE  NEGATIVE   Ketones, ur NEGATIVE  NEGATIVE mg/dL   Protein, ur NEGATIVE  NEGATIVE mg/dL   Urobilinogen, UA 0.2  0.0 - 1.0 mg/dL   Nitrite NEGATIVE  NEGATIVE   Leukocytes, UA NEGATIVE  NEGATIVE  WET PREP, GENITAL     Status: None   Collection Time    10/17/12 11:30 PM      Result Value Range   Yeast Wet Prep HPF POC NONE SEEN  NONE SEEN   Trich, Wet Prep NONE SEEN  NONE SEEN   Clue Cells Wet Prep HPF POC NONE SEEN  NONE SEEN   WBC, Wet Prep HPF POC NONE SEEN  NONE SEEN   US Transvaginal Non-ob  10/18/2012   *RADIOLOGY REPORT*  Clinical Data: Chronic abdominal pain.   TRANSABDOMINAL AND TRANSVAGINAL ULTRASOUND OF PELVIS Technique:  Both transabdominal and transvaginal ultrasound examinations of the pelvis were performed. Transabdominal technique was performed for global imaging of the pelvis including uterus, ovaries, adnexal regions, and pelvic cul-de-sac.  It was necessary to proceed with endovaginal exam following the transabdominal exam to visualize the ovaries and endometrium.  Comparison:  None  Findings:  Uterus: 90 mm x 36 mm x 45 mm.  Normal echotexture.  Myometrium appears normal peri  Endometrium: It normal for a woman of childbearing years measuring 11.8 mm.  Right ovary:  Normal physiologic appearance.  Left ovary: Normal physiologic appearance.  Other findings: No free fluid  IMPRESSION: Normal study. No evidence of pelvic mass or other significant abnormality.   Original Report Authenticated By: Andreas Newport, M.D.   US Pelvis Complete  10/18/2012   *RADIOLOGY REPORT*  Clinical Data: Chronic abdominal pain.  TRANSABDOMINAL AND TRANSVAGINAL ULTRASOUND OF PELVIS Technique:  Both transabdominal and transvaginal ultrasound examinations of the pelvis were performed. Transabdominal technique was performed for global imaging of the pelvis including uterus, ovaries, adnexal regions, and pelvic cul-de-sac.  It was necessary to proceed with endovaginal exam following the transabdominal exam to visualize the ovaries and endometrium.  Comparison:  None  Findings:  Uterus: 90 mm x 36 mm x 45 mm.  Normal echotexture.  Myometrium appears normal peri  Endometrium: It normal for a woman of childbearing years measuring 11.8 mm.  Right ovary:  Normal physiologic appearance.  Left ovary: Normal physiologic appearance.  Other findings: No free fluid  IMPRESSION: Normal study. No evidence of pelvic mass or other significant abnormality.   Original Report Authenticated By: Andreas Newport, M.D.    Pain relieved with toradol here in MAU Assessment and Plan   1. Pelvic pain   2.  Dyspareunia    FU with the clinic for annual and pap   Tawnya Crook 10/17/2012, 10:56 PM

## 2012-10-17 NOTE — MAU Note (Signed)
Pt states abdominal pain x 2 years, has been worse for the last 2 months. Lower abdominal pain that feels "deep" & "crampy. Also complains of low back and inner thigh pain. Reports 60 pound weight gain in last 1.5 yr and has episodes of sweating. Says things have been "strange" with her body for last couple of years but hasn't been to gynecologist since 2009. Denies vaginal bleeding, occasional vaginal discharge that looks like snot. Periods are irregular and heave. LMP 2 weeks ago. Denies birth control. Hx of ectopic pregnancy x 2 with removal of both fallopian tubes in 2004 & 2007.

## 2012-10-18 DIAGNOSIS — N949 Unspecified condition associated with female genital organs and menstrual cycle: Secondary | ICD-10-CM

## 2012-11-24 ENCOUNTER — Encounter: Payer: Self-pay | Admitting: Obstetrics and Gynecology

## 2013-01-12 ENCOUNTER — Other Ambulatory Visit: Payer: Self-pay | Admitting: Dermatology

## 2013-02-26 ENCOUNTER — Encounter (HOSPITAL_COMMUNITY): Payer: Self-pay | Admitting: Emergency Medicine

## 2013-02-26 ENCOUNTER — Emergency Department (HOSPITAL_COMMUNITY)
Admission: EM | Admit: 2013-02-26 | Discharge: 2013-02-26 | Disposition: A | Discharge status: Home / Self Care | Payer: PRIVATE HEALTH INSURANCE | Attending: Emergency Medicine | Admitting: Emergency Medicine

## 2013-02-26 DIAGNOSIS — J45909 Unspecified asthma, uncomplicated: Secondary | ICD-10-CM | POA: Insufficient documentation

## 2013-02-26 DIAGNOSIS — H9210 Otorrhea, unspecified ear: Secondary | ICD-10-CM | POA: Insufficient documentation

## 2013-02-26 DIAGNOSIS — S025XXA Fracture of tooth (traumatic), initial encounter for closed fracture: Secondary | ICD-10-CM | POA: Insufficient documentation

## 2013-02-26 DIAGNOSIS — Z9889 Other specified postprocedural states: Secondary | ICD-10-CM | POA: Insufficient documentation

## 2013-02-26 DIAGNOSIS — S032XXA Dislocation of tooth, initial encounter: Secondary | ICD-10-CM

## 2013-02-26 DIAGNOSIS — Z79899 Other long term (current) drug therapy: Secondary | ICD-10-CM | POA: Insufficient documentation

## 2013-02-26 DIAGNOSIS — F17200 Nicotine dependence, unspecified, uncomplicated: Secondary | ICD-10-CM | POA: Insufficient documentation

## 2013-02-26 DIAGNOSIS — Z8659 Personal history of other mental and behavioral disorders: Secondary | ICD-10-CM | POA: Insufficient documentation

## 2013-02-26 DIAGNOSIS — X58XXXA Exposure to other specified factors, initial encounter: Secondary | ICD-10-CM | POA: Insufficient documentation

## 2013-02-26 DIAGNOSIS — Z880 Allergy status to penicillin: Secondary | ICD-10-CM | POA: Insufficient documentation

## 2013-02-26 DIAGNOSIS — Z8679 Personal history of other diseases of the circulatory system: Secondary | ICD-10-CM | POA: Insufficient documentation

## 2013-02-26 DIAGNOSIS — H9209 Otalgia, unspecified ear: Secondary | ICD-10-CM | POA: Insufficient documentation

## 2013-02-26 DIAGNOSIS — Y929 Unspecified place or not applicable: Secondary | ICD-10-CM | POA: Insufficient documentation

## 2013-02-26 DIAGNOSIS — Y939 Activity, unspecified: Secondary | ICD-10-CM | POA: Insufficient documentation

## 2013-02-26 MED ORDER — CLINDAMYCIN HCL 150 MG PO CAPS: ORAL_CAPSULE | ORAL | Status: DC

## 2013-02-26 MED ORDER — IBUPROFEN 800 MG PO TABS: 800.0000 mg | ORAL_TABLET | Freq: Three times a day (TID) | ORAL | Status: DC | PRN

## 2013-02-26 NOTE — ED Provider Notes (Signed)
CSN: [id number]     Arrival date & time 02/26/13  1725 History  This chart was scribed for non-physician practitioner working with Audree Camel, MD by Ronal Fear, ED scribe. This patient was seen in room WTR6/WTR6 and the patient's care was started at 5:40 PM.      No chief complaint on file.   The history is provided by the patient. No language interpreter was used.   HPI Comments: Alexandra Holt is a 37 y.o. female who presents to the Emergency Department complaining of gradual onset dental and sudden onset intermittent throbbing left ear pain that itches with discharge.  Pt's dental pain is on the same side as the ear pain. She has a broken tooth. Pt has not taken any medication for the pain. Pt does not appear to be in any acute distress with no other complaints.  Past Medical History  Diagnosis Date  . Asthma   . H/O: drug dependency   . Narcotic addiction   . Depression   . Migraine    Past Surgical History  Procedure Laterality Date  . Ectopic pregnancy surgery    . Tooth extraction     Family History  Problem Relation Age of Onset  . Diabetes Other   . Hypertension Other   . Cancer Maternal Aunt    History  Substance Use Topics  . Smoking status: Current Every Day Smoker -- 1.00 packs/day for 20 years    Types: Cigarettes  . Smokeless tobacco: Not on file  . Alcohol Use: Yes     Comment: occ   OB History   Grav Para Term Preterm Abortions TAB SAB Ect Mult Living   7 1 1  6 4  2  1      Review of Systems  HENT: Positive for dental problem, ear discharge and ear pain.   All other systems reviewed and are negative.    Allergies  Penicillins and Sulfonamide derivatives  Home Medications   Current Outpatient Rx  Name  Route  Sig  Dispense  Refill  . adalimumab (HUMIRA) 40 MG/0.8ML injection   Subcutaneous   Inject 40 mg into the skin every 14 (fourteen) days.          Marland Kitchen albuterol (PROVENTIL HFA;VENTOLIN HFA) 108 (90 BASE) MCG/ACT inhaler  Inhalation   Inhale 2 puffs into the lungs every 6 (six) hours as needed for wheezing or shortness of breath.         . Buprenorphine HCl-Naloxone HCl (SUBOXONE) 4-1 MG FILM   Sublingual   Place 1 Film under the tongue daily.          Marland Kitchen Dextromethorphan HBr (COUGH RELIEF PO)   Oral   Take 10 mLs by mouth every 4 (four) hours as needed (cough).         Marland Kitchen FLUoxetine (PROZAC) 40 MG capsule   Oral   Take 40 mg by mouth daily.          Marland Kitchen ibuprofen (ADVIL,MOTRIN) 800 MG tablet   Oral   Take 800 mg by mouth every 8 (eight) hours as needed for pain.         . Vilazodone HCl (VIIBRYD PO)   Oral   Take by mouth.          BP 135/51  Pulse 84  Temp(Src) 98.7 F (37.1 C) (Oral)  SpO2 97% Physical Exam  Nursing note and vitals reviewed. Constitutional: She is oriented to person, place, and time. She appears well-developed and  well-nourished.  HENT:  Head: Normocephalic and atraumatic.  Mouth/Throat:    Eyes: Pupils are equal, round, and reactive to light.  Neck: Neck supple.  Cardiovascular: Normal rate, regular rhythm and normal heart sounds.   Pulmonary/Chest: Effort normal. No respiratory distress. She has no wheezes.  Neurological: She is alert and oriented to person, place, and time.  Skin: Skin is warm and dry.  Psychiatric: She has a normal mood and affect.    ED Course  Procedures (including critical care time)+ DIAGNOSTIC STUDIES: Oxygen Saturation is 97% on RA, normal by my interpretation.    COORDINATION OF CARE:  5:41 PM- Pt advised of plan for treatment including abx and pt agrees.  Patient is referred to dentistry and told to return here as needed.  She states she cannot take narcotics due to the fact she is on Suboxone.  Carlyle Dolly, PA-C 02/27/13 1408  Carlyle Dolly, PA-C 02/27/13 1409

## 2013-02-26 NOTE — ED Notes (Signed)
Pt c/o L side ear pain and L sided dental pain for about 2 weeks. Pt states she needs a dental referral. Pt with no acute distress.

## 2013-02-27 NOTE — ED Provider Notes (Signed)
Medical screening examination/treatment/procedure(s) were performed by non-physician practitioner and as supervising physician I was immediately available for consultation/collaboration.  EKG Interpretation   None         Rosabelle Jupin T Ladell Bey, MD 02/27/13 1639 

## 2013-04-27 ENCOUNTER — Ambulatory Visit: Payer: Self-pay | Admitting: Endocrinology

## 2013-06-12 ENCOUNTER — Encounter (HOSPITAL_COMMUNITY): Payer: Self-pay | Admitting: Emergency Medicine

## 2013-06-12 ENCOUNTER — Emergency Department (HOSPITAL_COMMUNITY): Payer: PRIVATE HEALTH INSURANCE

## 2013-06-12 ENCOUNTER — Emergency Department (HOSPITAL_COMMUNITY)
Admission: EM | Admit: 2013-06-12 | Discharge: 2013-06-12 | Disposition: A | Discharge status: Home / Self Care | Payer: PRIVATE HEALTH INSURANCE | Attending: Emergency Medicine | Admitting: Emergency Medicine

## 2013-06-12 DIAGNOSIS — Z792 Long term (current) use of antibiotics: Secondary | ICD-10-CM | POA: Insufficient documentation

## 2013-06-12 DIAGNOSIS — Z79899 Other long term (current) drug therapy: Secondary | ICD-10-CM | POA: Insufficient documentation

## 2013-06-12 DIAGNOSIS — J40 Bronchitis, not specified as acute or chronic: Secondary | ICD-10-CM | POA: Diagnosis present

## 2013-06-12 DIAGNOSIS — F17200 Nicotine dependence, unspecified, uncomplicated: Secondary | ICD-10-CM | POA: Insufficient documentation

## 2013-06-12 DIAGNOSIS — F329 Major depressive disorder, single episode, unspecified: Secondary | ICD-10-CM | POA: Insufficient documentation

## 2013-06-12 DIAGNOSIS — J45901 Unspecified asthma with (acute) exacerbation: Secondary | ICD-10-CM | POA: Insufficient documentation

## 2013-06-12 DIAGNOSIS — F3289 Other specified depressive episodes: Secondary | ICD-10-CM | POA: Insufficient documentation

## 2013-06-12 DIAGNOSIS — Z880 Allergy status to penicillin: Secondary | ICD-10-CM | POA: Insufficient documentation

## 2013-06-12 DIAGNOSIS — Z8679 Personal history of other diseases of the circulatory system: Secondary | ICD-10-CM | POA: Insufficient documentation

## 2013-06-12 LAB — BASIC METABOLIC PANEL
BUN: 10 mg/dL (ref 6–23)
CHLORIDE: 100 meq/L (ref 96–112)
CO2: 24 mEq/L (ref 19–32)
Calcium: 8.9 mg/dL (ref 8.4–10.5)
Creatinine, Ser: 0.65 mg/dL (ref 0.50–1.10)
Glucose, Bld: 112 mg/dL — ABNORMAL HIGH (ref 70–99)
POTASSIUM: 3.7 meq/L (ref 3.7–5.3)
Sodium: 137 mEq/L (ref 137–147)

## 2013-06-12 LAB — CBC WITH DIFFERENTIAL/PLATELET
BASOS ABS: 0 10*3/uL (ref 0.0–0.1)
BASOS PCT: 0 % (ref 0–1)
EOS ABS: 0.2 10*3/uL (ref 0.0–0.7)
Eosinophils Relative: 2 % (ref 0–5)
HCT: 45 % (ref 36.0–46.0)
Hemoglobin: 15 g/dL (ref 12.0–15.0)
Lymphocytes Relative: 47 % — ABNORMAL HIGH (ref 12–46)
Lymphs Abs: 3.4 10*3/uL (ref 0.7–4.0)
MCH: 31.2 pg (ref 26.0–34.0)
MCHC: 33.3 g/dL (ref 30.0–36.0)
MCV: 93.6 fL (ref 78.0–100.0)
MONOS PCT: 6 % (ref 3–12)
Monocytes Absolute: 0.4 10*3/uL (ref 0.1–1.0)
NEUTROS ABS: 3.3 10*3/uL (ref 1.7–7.7)
NEUTROS PCT: 45 % (ref 43–77)
PLATELETS: 238 10*3/uL (ref 150–400)
RBC: 4.81 MIL/uL (ref 3.87–5.11)
RDW: 13.2 % (ref 11.5–15.5)
WBC: 7.3 10*3/uL (ref 4.0–10.5)

## 2013-06-12 LAB — TROPONIN I

## 2013-06-12 MED ORDER — PREDNISONE 20 MG PO TABS
60.0000 mg | ORAL_TABLET | Freq: Once | ORAL | Status: AC
  Administered 2013-06-12: 60 mg via ORAL
  Filled 2013-06-12: qty 3

## 2013-06-12 MED ORDER — PREDNISONE 50 MG PO TABS: ORAL_TABLET | ORAL | Status: AC

## 2013-06-12 MED ORDER — ALBUTEROL SULFATE (2.5 MG/3ML) 0.083% IN NEBU
5.0000 mg | INHALATION_SOLUTION | Freq: Once | RESPIRATORY_TRACT | Status: AC
  Administered 2013-06-12: 5 mg via RESPIRATORY_TRACT
  Filled 2013-06-12: qty 6

## 2013-06-12 MED ORDER — ALBUTEROL SULFATE HFA 108 (90 BASE) MCG/ACT IN AERS: 1.0000 | INHALATION_SPRAY | Freq: Four times a day (QID) | RESPIRATORY_TRACT | Status: DC | PRN

## 2013-06-12 MED ORDER — ALBUTEROL SULFATE HFA 108 (90 BASE) MCG/ACT IN AERS
2.0000 | INHALATION_SPRAY | Freq: Once | RESPIRATORY_TRACT | Status: AC
  Administered 2013-06-12: 2 via RESPIRATORY_TRACT
  Filled 2013-06-12: qty 6.7

## 2013-06-12 MED ORDER — METHYLPREDNISOLONE SODIUM SUCC 125 MG IJ SOLR
125.0000 mg | Freq: Once | INTRAMUSCULAR | Status: DC
  Filled 2013-06-12: qty 2

## 2013-06-12 NOTE — Discharge Instructions (Signed)
Bronchitis °Bronchitis is swelling (inflammation) of the air tubes leading to your lungs (bronchi). This causes mucus and a cough. If the swelling gets bad, you may have trouble breathing. °HOME CARE  °· Rest. °· Drink enough fluids to keep your pee (urine) clear or pale yellow (unless you have a condition where you have to watch how much you drink). °· Only take medicine as told by your doctor. If you were given antibiotic medicines, finish them even if you start to feel better. °· Avoid smoke, irritating chemicals, and strong smells. These make the problem worse. Quit smoking if you smoke. This helps your lungs heal faster. °· Use a cool mist humidifier. Change the water in the humidifier every day. You can also sit in the bathroom with hot shower running for 5 10 minutes. Keep the door closed. °· See your health care provider as told. °· Wash your hands often. °GET HELP IF: °Your problems do not get better after 1 week. °GET HELP RIGHT AWAY IF:  °· Your fever gets worse. °· You have chills. °· Your chest hurts. °· Your problems breathing get worse. °· You have blood in your mucus. °· You pass out (faint). °· You feel lightheaded. °· You have a bad headache. °· You throw up (vomit) again and again. °MAKE SURE YOU: °· Understand these instructions. °· Will watch your condition. °· Will get help right away if you are not doing well or get worse. °Document Released: 09/24/2007 Document Revised: 01/26/2013 Document Reviewed: 11/30/2012 °[employer]® Patient Information ©2014 [employer]. ° °

## 2013-06-12 NOTE — ED Provider Notes (Signed)
CSN: [id number]     Arrival date & time 06/12/13  1191 History   First MD Initiated Contact with Patient 06/12/13 0703     Chief Complaint  Patient presents with  . Shortness of Breath  . Cough     (Consider location/radiation/quality/duration/timing/severity/associated sxs/prior Treatment) Patient is a 38 y.o. female presenting with shortness of breath and cough. The history is provided by the patient.  Shortness of Breath Severity:  Mild Onset quality:  Gradual Duration:  3 weeks Timing:  Constant Progression:  Unchanged Chronicity:  New Context: URI   Relieved by:  Nothing Worsened by:  Nothing tried Ineffective treatments:  None tried Associated symptoms: cough and wheezing (pt does hear herself wheezing occasionally)   Associated symptoms: no abdominal pain, no chest pain, no fever, no headaches, no neck pain and no vomiting   Cough:    Cough characteristics:  Productive   Sputum characteristics:  Manson Passey   Severity:  Moderate   Onset quality:  Gradual   Timing:  Constant   Progression:  Worsening   Chronicity:  Chronic Cough Associated symptoms: shortness of breath and wheezing (pt does hear herself wheezing occasionally)   Associated symptoms: no chest pain, no fever and no headaches     Past Medical History  Diagnosis Date  . Asthma   . H/O: drug dependency   . Narcotic addiction   . Depression   . Migraine    Past Surgical History  Procedure Laterality Date  . Ectopic pregnancy surgery    . Tooth extraction     Family History  Problem Relation Age of Onset  . Diabetes Other   . Hypertension Other   . Cancer Maternal Aunt    History  Substance Use Topics  . Smoking status: Current Every Day Smoker -- 1.00 packs/day for 20 years    Types: Cigarettes  . Smokeless tobacco: Not on file  . Alcohol Use: Yes     Comment: occ   OB History   Grav Para Term Preterm Abortions TAB SAB Ect Mult Living   7 1 1  6 4  2  1      Review of Systems   Constitutional: Negative for fever and fatigue.  HENT: Negative for congestion and drooling.   Eyes: Negative for pain.  Respiratory: Positive for cough, chest tightness, shortness of breath and wheezing (pt does hear herself wheezing occasionally).   Cardiovascular: Negative for chest pain.  Gastrointestinal: Negative for nausea, vomiting, abdominal pain and diarrhea.  Genitourinary: Negative for dysuria and hematuria.  Musculoskeletal: Negative for back pain, gait problem and neck pain.  Skin: Negative for color change.  Neurological: Negative for dizziness and headaches.  Hematological: Negative for adenopathy.  Psychiatric/Behavioral: Negative for behavioral problems.  All other systems reviewed and are negative.      Allergies  Penicillins and Sulfonamide derivatives  Home Medications   Current Outpatient Rx  Name  Route  Sig  Dispense  Refill  . adalimumab (HUMIRA) 40 MG/0.8ML injection   Subcutaneous   Inject 40 mg into the skin every 14 (fourteen) days.          Marland Kitchen albuterol (PROVENTIL HFA;VENTOLIN HFA) 108 (90 BASE) MCG/ACT inhaler   Inhalation   Inhale 2 puffs into the lungs every 6 (six) hours as needed for wheezing or shortness of breath.         . Buprenorphine HCl-Naloxone HCl (SUBOXONE) 4-1 MG FILM   Sublingual   Place 1 Film under the tongue daily.          Marland Kitchen  clindamycin (CLEOCIN) 150 MG capsule      3 PO TID for 7 days   63 capsule   0   . Dextromethorphan HBr (COUGH RELIEF PO)   Oral   Take 10 mLs by mouth every 4 (four) hours as needed (cough).         Marland Kitchen FLUoxetine (PROZAC) 40 MG capsule   Oral   Take 40 mg by mouth daily.          Marland Kitchen ibuprofen (ADVIL,MOTRIN) 800 MG tablet   Oral   Take 800 mg by mouth every 8 (eight) hours as needed for pain.         Marland Kitchen ibuprofen (ADVIL,MOTRIN) 800 MG tablet   Oral   Take 1 tablet (800 mg total) by mouth every 8 (eight) hours as needed.   21 tablet   0   . Vilazodone HCl (VIIBRYD PO)    Oral   Take by mouth.          BP 129/62  Pulse 77  Temp(Src) 98.4 F (36.9 C) (Oral)  Resp 20  Ht 6' (1.829 m)  Wt 220 lb (99.791 kg)  BMI 29.83 kg/m2  SpO2 97%  LMP 05/12/2013 Physical Exam  Nursing note and vitals reviewed. Constitutional: She is oriented to person, place, and time. She appears well-developed and well-nourished.  HENT:  Head: Normocephalic.  Mouth/Throat: Oropharynx is clear and moist. No oropharyngeal exudate.  Eyes: Conjunctivae and EOM are normal. Pupils are equal, round, and reactive to light.  Neck: Normal range of motion. Neck supple.  Cardiovascular: Normal rate, regular rhythm, normal heart sounds and intact distal pulses.  Exam reveals no gallop and no friction rub.   No murmur heard. Pulmonary/Chest: Effort normal and breath sounds normal. No respiratory distress. She has no wheezes.  Abdominal: Soft. Bowel sounds are normal. There is no tenderness. There is no rebound and no guarding.  Musculoskeletal: Normal range of motion. She exhibits no edema and no tenderness.  Neurological: She is alert and oriented to person, place, and time.  Skin: Skin is warm and dry.  Psychiatric: She has a normal mood and affect. Her behavior is normal.    ED Course  Procedures (including critical care time) Labs Review Labs Reviewed  CBC WITH DIFFERENTIAL - Abnormal; Notable for the following:    Lymphocytes Relative 47 (*)    All other components within normal limits  BASIC METABOLIC PANEL - Abnormal; Notable for the following:    Glucose, Bld 112 (*)    All other components within normal limits  TROPONIN I   Imaging Review Dg Chest 2 View  06/12/2013   CLINICAL DATA:  Cough shortness of breath  EXAM: CHEST  2 VIEW  COMPARISON:  08/31/2012  FINDINGS: The heart size and mediastinal contours are within normal limits. Both lungs are clear. The visualized skeletal structures are unremarkable.  IMPRESSION: No active cardiopulmonary disease.   Electronically  Signed   By: Ruel Favors M.D.   On: 06/12/2013 08:20    EKG Interpretation    Date/Time:  Sunday June 12 2013 08:48:23 EST Ventricular Rate:  75 PR Interval:  131 QRS Duration: 95 QT Interval:  397 QTC Calculation: 443 R Axis:   -12 Text Interpretation:  Sinus rhythm Probable anteroseptal infarct, old No significant change since last tracing Confirmed by Dewane Timson  MD, Abbigayle Toole ([id number]) on 06/12/2013 8:51:05 AM            MDM   Final diagnoses:  Bronchitis  7:25 AM 38 y.o. female with a history of asthma and a pack-a-day smoker who presents with worsening of chronic cough and shortness of breath. She notes that she has developed a chronic cough for the last few years which has seemed to worsen over the last few weeks. It is productive of brown sputum particularly in the morning. She also notes some mild constant chest tightness which is also worse in the mornings. She states that she began feeling more short of breath when her cough got worse several weeks ago. She is afebrile and vital signs are unremarkable here. Well's/Perc neg. Hx suspicious for chronic bronchitis w/ possibly concurrent viral URI vs pna. No wheezing heard on exam the patient has had relief with breathing treatments in the past. Will get screening labwork, chest x-ray, and albuterol treatment.  9:29 AM: I interpreted/reviewed the labs and/or imaging which were non-contributory. Low risk for MACE per HEART score and chest tightness likely related to bronchitis. Pt had relief w/ alb neb. Will send home w/ inhaler and steroids. I have discussed the diagnosis/risks/treatment options with the patient and believe the pt to be eligible for discharge home to follow-up with pcp as needed. We also discussed returning to the ED immediately if new or worsening sx occur. We discussed the sx which are most concerning (e.g., worsening sob, cp, fever) that necessitate immediate return. Medications administered to the patient  during their visit and any new prescriptions provided to the patient are listed below.  Medications given during this visit Medications  albuterol (PROVENTIL) (2.5 MG/3ML) 0.083% nebulizer solution 5 mg (5 mg Nebulization Given 06/12/13 0731)  predniSONE (DELTASONE) tablet 60 mg (60 mg Oral Given 06/12/13 0907)  albuterol (PROVENTIL HFA;VENTOLIN HFA) 108 (90 BASE) MCG/ACT inhaler 2 puff (2 puffs Inhalation Given 06/12/13 0915)    New Prescriptions   ALBUTEROL (PROVENTIL HFA;VENTOLIN HFA) 108 (90 BASE) MCG/ACT INHALER    Inhale 1-2 puffs into the lungs every 6 (six) hours as needed for wheezing or shortness of breath.   PREDNISONE (DELTASONE) 50 MG TABLET    Take 1 tablet by mouth daily for the next 4 days.     Junius ArgyleForrest S Adoni Greenough, MD 06/13/13 [phone]

## 2013-06-15 ENCOUNTER — Encounter: Payer: Self-pay | Admitting: Endocrinology

## 2013-06-15 ENCOUNTER — Ambulatory Visit (INDEPENDENT_AMBULATORY_CARE_PROVIDER_SITE_OTHER): Payer: PRIVATE HEALTH INSURANCE | Admitting: Endocrinology

## 2013-06-15 VITALS — BP 108/60 | HR 81 | Temp 98.2°F | Ht 72.0 in | Wt 228.0 lb

## 2013-06-15 DIAGNOSIS — R635 Abnormal weight gain: Secondary | ICD-10-CM

## 2013-06-15 LAB — TSH: TSH: 1.13 u[IU]/mL (ref 0.35–5.50)

## 2013-06-15 LAB — T3, FREE: T3, Free: 2.4 pg/mL (ref 2.3–4.2)

## 2013-06-15 LAB — T4, FREE: FREE T4: 0.86 ng/dL (ref 0.60–1.60)

## 2013-06-15 LAB — CORTISOL: Cortisol, Plasma: 1.9 ug/dL

## 2013-06-15 MED ORDER — DEXAMETHASONE 1 MG PO TABS: ORAL_TABLET | ORAL | Status: DC

## 2013-06-15 NOTE — Progress Notes (Signed)
Subjective:    Patient ID: Alexandra PolingShannon B Margraf, female    DOB: [birth year], 38 y.o.   MRN: [id number]  HPI Pt states few years of severe weight gain throughout the body, and assoc heat intolerance.   Past Medical History  Diagnosis Date  . Asthma   . H/O: drug dependency   . Narcotic addiction   . Depression   . Migraine     Past Surgical History  Procedure Laterality Date  . Ectopic pregnancy surgery    . Tooth extraction      History   Social History  . Marital Status: Married    Spouse Name: N/A    Number of Children: N/A  . Years of Education: N/A   Occupational History  . Not on file.   Social History Main Topics  . Smoking status: Current Every Day Smoker -- 1.00 packs/day for 20 years    Types: Cigarettes  . Smokeless tobacco: Not on file  . Alcohol Use: Yes     Comment: occ  . Drug Use: No  . Sexual Activity: Yes   Other Topics Concern  . Not on file   Social History Narrative  . No narrative on file    Current Outpatient Prescriptions on File Prior to Visit  Medication Sig Dispense Refill  . adalimumab (HUMIRA) 40 MG/0.8ML injection Inject 40 mg into the skin every 14 (fourteen) days.       Marland Kitchen. albuterol (PROVENTIL HFA;VENTOLIN HFA) 108 (90 BASE) MCG/ACT inhaler Inhale 1-2 puffs into the lungs every 6 (six) hours as needed for wheezing or shortness of breath.  1 Inhaler  0  . Buprenorphine HCl-Naloxone HCl (SUBOXONE) 4-1 MG FILM Place 1 Film under the tongue daily.       Marland Kitchen. FLUoxetine (PROZAC) 40 MG capsule Take 40 mg by mouth daily.       Marland Kitchen. ibuprofen (ADVIL,MOTRIN) 200 MG tablet Take 800 mg by mouth every 6 (six) hours as needed for mild pain.      . predniSONE (DELTASONE) 50 MG tablet Take 1 tablet by mouth daily for the next 4 days.  4 tablet  0   No current facility-administered medications on file prior to visit.    Allergies  Allergen Reactions  . Penicillins Other (See Comments)    Unknown reaction  . Sulfonamide Derivatives Hives and Rash     Family History  Problem Relation Age of Onset  . Diabetes Other   . Hypertension Other   . Cancer Maternal Aunt   no thyroid problems  BP 108/60  Pulse 81  Temp(Src) 98.2 F (36.8 C) (Oral)  Ht 6' (1.829 m)  Wt 228 lb (103.42 kg)  BMI 30.92 kg/m2  SpO2 97%  LMP 05/12/2013    Review of Systems denies sob, memory loss, numbness, blurry vision, rhinorrhea, and syncope.  She has fatigue, hair loss, dry skin, muscle cramps, myalgias, constipation, irregular menses, easy bruising, depression, palpitations, and decreased libido.      Objective:   Physical Exam VS: see vs page GEN: no distress HEAD: head: no deformity eyes: no periorbital swelling, no proptosis external nose and ears are normal mouth: no lesion seen NECK: supple, thyroid is not enlarged CHEST WALL: no deformity LUNGS:  Clear to auscultation. CV: reg rate and rhythm, no murmur ABD: abdomen is soft, nontender.  no hepatosplenomegaly.  not distended.  no hernia MUSCULOSKELETAL: muscle bulk and strength are grossly normal.  no obvious joint swelling.  gait is normal and steady EXTEMITIES: no  deformity. no edema PULSES: dorsalis pedis intact bilat.  no carotid bruit NEURO:  cn 2-12 grossly intact.   readily moves all 4's.  sensation is intact to touch on all 4's.  No tremor.  SKIN:  Normal texture and temperature.  No rash or suspicious lesion is visible.   NODES:  None palpable at the neck PSYCH: alert, well-oriented.  Does not appear anxious nor depressed.   Lab Results  Component Value Date   TSH 1.13 06/15/2013   Cortisol (today--not on dex test)=1.9    Assessment & Plan:  Weight gain: this cortisol excludes endogenous cushing's. Heat intolerance: not thyroid-related. Bipolar affective disorder: this limits interpretation of sxs.

## 2013-06-15 NOTE — Patient Instructions (Addendum)
blood tests are being requested for you today.  We'll contact you with results. you should do a "dexamethasone suppression test."  for this, you would take dexamethasone 1 mg at 10 pm, then come in for a "cortisol" blood test the next morning before 9 am.  you do not need to be fasting for this test.

## 2013-06-20 ENCOUNTER — Other Ambulatory Visit: Payer: Self-pay

## 2013-06-20 ENCOUNTER — Other Ambulatory Visit: Payer: Self-pay | Admitting: *Deleted

## 2013-06-20 ENCOUNTER — Other Ambulatory Visit (INDEPENDENT_AMBULATORY_CARE_PROVIDER_SITE_OTHER): Payer: PRIVATE HEALTH INSURANCE

## 2013-06-20 ENCOUNTER — Telehealth: Payer: Self-pay | Admitting: Endocrinology

## 2013-06-20 DIAGNOSIS — R635 Abnormal weight gain: Secondary | ICD-10-CM

## 2013-06-20 LAB — CORTISOL: Cortisol, Plasma: 1 ug/dL

## 2013-06-20 NOTE — Telephone Encounter (Signed)
Pt requesting return call regarding a few questions and lab results

## 2013-06-21 ENCOUNTER — Encounter: Payer: Self-pay | Admitting: Endocrinology

## 2013-06-21 NOTE — Telephone Encounter (Signed)
Pt discussing with Dr via my chart.

## 2013-06-23 ENCOUNTER — Encounter: Payer: Self-pay | Admitting: Obstetrics and Gynecology

## 2013-08-08 ENCOUNTER — Emergency Department (HOSPITAL_COMMUNITY)
Admission: EM | Admit: 2013-08-08 | Discharge: 2013-08-08 | Disposition: A | Discharge status: Home / Self Care | Payer: PRIVATE HEALTH INSURANCE | Attending: Emergency Medicine | Admitting: Emergency Medicine

## 2013-08-08 ENCOUNTER — Encounter (HOSPITAL_COMMUNITY): Payer: Self-pay | Admitting: Emergency Medicine

## 2013-08-08 DIAGNOSIS — Y929 Unspecified place or not applicable: Secondary | ICD-10-CM | POA: Insufficient documentation

## 2013-08-08 DIAGNOSIS — T148XXA Other injury of unspecified body region, initial encounter: Secondary | ICD-10-CM

## 2013-08-08 DIAGNOSIS — IMO0002 Reserved for concepts with insufficient information to code with codable children: Secondary | ICD-10-CM | POA: Insufficient documentation

## 2013-08-08 DIAGNOSIS — F329 Major depressive disorder, single episode, unspecified: Secondary | ICD-10-CM | POA: Insufficient documentation

## 2013-08-08 DIAGNOSIS — Y9389 Activity, other specified: Secondary | ICD-10-CM | POA: Insufficient documentation

## 2013-08-08 DIAGNOSIS — J45909 Unspecified asthma, uncomplicated: Secondary | ICD-10-CM | POA: Insufficient documentation

## 2013-08-08 DIAGNOSIS — M549 Dorsalgia, unspecified: Secondary | ICD-10-CM

## 2013-08-08 DIAGNOSIS — S336XXA Sprain of sacroiliac joint, initial encounter: Secondary | ICD-10-CM | POA: Insufficient documentation

## 2013-08-08 DIAGNOSIS — Z880 Allergy status to penicillin: Secondary | ICD-10-CM | POA: Insufficient documentation

## 2013-08-08 DIAGNOSIS — F17200 Nicotine dependence, unspecified, uncomplicated: Secondary | ICD-10-CM | POA: Insufficient documentation

## 2013-08-08 DIAGNOSIS — X500XXA Overexertion from strenuous movement or load, initial encounter: Secondary | ICD-10-CM | POA: Insufficient documentation

## 2013-08-08 DIAGNOSIS — Z79899 Other long term (current) drug therapy: Secondary | ICD-10-CM | POA: Insufficient documentation

## 2013-08-08 DIAGNOSIS — F411 Generalized anxiety disorder: Secondary | ICD-10-CM | POA: Insufficient documentation

## 2013-08-08 DIAGNOSIS — F3289 Other specified depressive episodes: Secondary | ICD-10-CM | POA: Insufficient documentation

## 2013-08-08 DIAGNOSIS — Z8679 Personal history of other diseases of the circulatory system: Secondary | ICD-10-CM | POA: Insufficient documentation

## 2013-08-08 HISTORY — DX: Anxiety disorder, unspecified: F41.9

## 2013-08-08 MED ORDER — IBUPROFEN 800 MG PO TABS: 800.0000 mg | ORAL_TABLET | Freq: Three times a day (TID) | ORAL | Status: DC

## 2013-08-08 NOTE — ED Provider Notes (Signed)
CSN: [id number]     Arrival date & time 08/08/13  1030 History  This chart was scribed for non-physician practitioner, Mellody DrownLauren Lenoir Facchini, PA-C, working with Toy BakerAnthony T Allen, MD, by Ellin MayhewMichael Levi, ED Scribe. This patient was seen in room TR10C/TR10C and the patient's care was started at 12:12 PM.   HPI Comments: Alexandra Holt is a 38 y.o. female who presents to the Emergency Department with a chief complaint of low back pain with onset yesterday. Patient reports discomfort while bending over yesterday. Patient states that she "temporarily lost the ability to stand ", due to pain,  and fell to her knees, denies head injury or LOC. She characterizes her pain as a shooting sensation to back worsen with changes in position/movement. Denies radiating to lower extremities.  Patient is able to move her legs and is ambulatory. She reports a mild episode of back pain in the past that lasted approximately 5 days and resolved with ice and ibuprofen. Patient reports taking motrin and naproxen with minimal relief, reports "running out of ibuprofen and naproxen today". 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   The history is provided by the patient. No language interpreter was used.   Past Medical History  Diagnosis Date  . Asthma   . H/O: drug dependency   . Narcotic addiction   . Depression   . Migraine   . Anxiety    Past Surgical History  Procedure Laterality Date  . Ectopic pregnancy surgery    . Tooth extraction     Family History  Problem Relation Age of Onset  . Diabetes Other   . Hypertension Other   . Cancer Maternal Aunt    History  Substance Use Topics  . Smoking status: Current Every Day Smoker -- 1.00 packs/day for 20 years    Types: Cigarettes  . Smokeless tobacco: Not on file  . Alcohol Use: Yes     Comment: occ   OB History   Grav Para Term Preterm Abortions TAB SAB Ect Mult Living   7 1 1  6 4  2  1      Review of Systems  Constitutional: Negative for fever and chills.   Respiratory: Negative for shortness of breath.   Gastrointestinal: Negative for nausea and vomiting.  Musculoskeletal: Positive for back pain and myalgias. Negative for arthralgias and gait problem.  Skin: Negative for wound.  Neurological: Negative for weakness and numbness.  All other systems reviewed and are negative.  Allergies  Penicillins and Sulfonamide derivatives  Home Medications   Prior to Admission medications   Medication Sig Start Date End Date Taking? Authorizing Provider  adalimumab (HUMIRA) 40 MG/0.8ML injection Inject 40 mg into the skin every 14 (fourteen) days.     Historical Provider, MD  albuterol (PROVENTIL HFA;VENTOLIN HFA) 108 (90 BASE) MCG/ACT inhaler Inhale 1-2 puffs into the lungs every 6 (six) hours as needed for wheezing or shortness of breath. 06/12/13   Junius ArgyleForrest S Harrison, MD  Buprenorphine HCl-Naloxone HCl (SUBOXONE) 4-1 MG FILM Place 1 Film under the tongue daily.     Historical Provider, MD  dexamethasone (DECADRON) 1 MG tablet Take at 9-10 pm, the night before blood test 06/15/13   Romero BellingSean Ellison, MD  FLUoxetine (PROZAC) 40 MG capsule Take 40 mg by mouth daily.     Historical Provider, MD  ibuprofen (ADVIL,MOTRIN) 200 MG tablet Take 800 mg by mouth every 6 (six) hours as needed for mild pain.    Historical Provider, MD   Triage Vitals:  BP 130/72  Pulse 77  Temp(Src) 98.1 F (36.7 C) (Oral)  Wt 229 lb (103.874 kg)  SpO2 100%  LMP 08/07/2013  Physical Exam  Nursing note and vitals reviewed. Constitutional: She is oriented to person, place, and time. She appears well-developed and well-nourished. No distress.  HENT:  Head: Normocephalic and atraumatic.  Eyes: EOM are normal.  Neck: Neck supple.  Cardiovascular: Normal rate.   Pulmonary/Chest: Effort normal. No respiratory distress.  Musculoskeletal: Normal range of motion.       Lumbar back: She exhibits tenderness and spasm. She exhibits normal range of motion, no swelling and no deformity.        Back:  No midline T-spine, or L-spine tenderness with no step-offs, crepitus, or deformities noted. Normal gait,without assistance.  Neurological: She is alert and oriented to person, place, and time. No sensory deficit. She exhibits normal muscle tone. Gait normal.  Skin: Skin is warm and dry. She is not diaphoretic.  Psychiatric: She has a normal mood and affect. Her behavior is normal.    ED Course  Procedures (including critical care time)  COORDINATION OF CARE: 12:15 PM-Discussed my suspicion of a muscle spasm. Encouraged patient to continue applying ice and to take ibuprofen. Recommended seeing a specialist if symptoms persist. Treatment plan discussed with patient and patient agrees.  MDM   Final diagnoses:  Muscle strain  Back pain   Patient presents with a lower back strain, spasm noted on exam. No other injury. Likely musculoskeletal. RICE encouraged. Patient with back pain. No concern for cauda equina or cord compression. Follow up with specialist and ice. Requesting multiple days off of work. Discussed giving her one day off and need to follow up with PCP or specialist if she needs more. Discussed lab results, imaging results, and treatment plan with the patient. Return precautions given. Reports understanding and no other concerns at this time.  Patient is stable for discharge at this time.   Meds given in ED:  Medications - No data to display  Discharge Medication List as of 08/08/2013 12:21 PM    START taking these medications   Details  !! ibuprofen (ADVIL,MOTRIN) 800 MG tablet Take 1 tablet (800 mg total) by mouth 3 (three) times daily. Take with food, Starting 08/08/2013, Until Discontinued, Print     !! - Potential duplicate medications found. Please discuss with provider.      I personally performed the services described in this documentation, which was scribed in my presence. The recorded information has been reviewed and is accurate.    Clabe SealLauren M Karley Pho,  PA-C 08/09/13 1513

## 2013-08-08 NOTE — ED Notes (Signed)
Pt c/o low back pain onset yesterday while standing and bending over felt something give in her back and pt fell. Pt has tried motrin and naproxen. Pt able to move her legs.

## 2013-08-08 NOTE — Discharge Instructions (Signed)
Call for a follow up appointment with Dr. Jeannetta NapElkins. Call Dr. Noel Geroldohen for further evaluation of your back pain. Return if Symptoms worsen.   Take medication as prescribed.

## 2013-08-12 NOTE — ED Provider Notes (Signed)
Medical screening examination/treatment/procedure(s) were performed by non-physician practitioner and as supervising physician I was immediately available for consultation/collaboration.   EKG Interpretation None       Chelby Salata T Irby Fails, MD 08/12/13 1421 

## 2014-02-20 ENCOUNTER — Encounter (HOSPITAL_COMMUNITY): Payer: Self-pay | Admitting: Emergency Medicine

## 2014-03-12 ENCOUNTER — Encounter (HOSPITAL_COMMUNITY): Payer: Self-pay | Admitting: Emergency Medicine

## 2014-03-12 ENCOUNTER — Emergency Department (HOSPITAL_COMMUNITY)
Admission: EM | Admit: 2014-03-12 | Discharge: 2014-03-12 | Disposition: A | Discharge status: Home / Self Care | Payer: PRIVATE HEALTH INSURANCE | Attending: Emergency Medicine | Admitting: Emergency Medicine

## 2014-03-12 ENCOUNTER — Emergency Department (HOSPITAL_COMMUNITY): Payer: PRIVATE HEALTH INSURANCE

## 2014-03-12 DIAGNOSIS — F419 Anxiety disorder, unspecified: Secondary | ICD-10-CM | POA: Diagnosis not present

## 2014-03-12 DIAGNOSIS — R109 Unspecified abdominal pain: Secondary | ICD-10-CM | POA: Diagnosis not present

## 2014-03-12 DIAGNOSIS — N939 Abnormal uterine and vaginal bleeding, unspecified: Secondary | ICD-10-CM | POA: Insufficient documentation

## 2014-03-12 DIAGNOSIS — Z720 Tobacco use: Secondary | ICD-10-CM | POA: Insufficient documentation

## 2014-03-12 DIAGNOSIS — F329 Major depressive disorder, single episode, unspecified: Secondary | ICD-10-CM | POA: Diagnosis not present

## 2014-03-12 DIAGNOSIS — Z792 Long term (current) use of antibiotics: Secondary | ICD-10-CM | POA: Diagnosis not present

## 2014-03-12 DIAGNOSIS — Z880 Allergy status to penicillin: Secondary | ICD-10-CM | POA: Diagnosis not present

## 2014-03-12 DIAGNOSIS — Z79899 Other long term (current) drug therapy: Secondary | ICD-10-CM | POA: Insufficient documentation

## 2014-03-12 DIAGNOSIS — N12 Tubulo-interstitial nephritis, not specified as acute or chronic: Secondary | ICD-10-CM | POA: Insufficient documentation

## 2014-03-12 DIAGNOSIS — G43909 Migraine, unspecified, not intractable, without status migrainosus: Secondary | ICD-10-CM | POA: Insufficient documentation

## 2014-03-12 DIAGNOSIS — J45909 Unspecified asthma, uncomplicated: Secondary | ICD-10-CM | POA: Diagnosis not present

## 2014-03-12 DIAGNOSIS — Z3202 Encounter for pregnancy test, result negative: Secondary | ICD-10-CM | POA: Insufficient documentation

## 2014-03-12 LAB — URINALYSIS, ROUTINE W REFLEX MICROSCOPIC
BILIRUBIN URINE: NEGATIVE
Glucose, UA: NEGATIVE mg/dL
Hgb urine dipstick: NEGATIVE
Ketones, ur: NEGATIVE mg/dL
Leukocytes, UA: NEGATIVE
Nitrite: NEGATIVE
Protein, ur: NEGATIVE mg/dL
SPECIFIC GRAVITY, URINE: 1.012 (ref 1.005–1.030)
UROBILINOGEN UA: 0.2 mg/dL (ref 0.0–1.0)
pH: 6.5 (ref 5.0–8.0)

## 2014-03-12 LAB — I-STAT CHEM 8, ED
BUN: 11 mg/dL (ref 6–23)
CREATININE: 0.8 mg/dL (ref 0.50–1.10)
Calcium, Ion: 1.1 mmol/L — ABNORMAL LOW (ref 1.12–1.23)
Chloride: 105 mEq/L (ref 96–112)
GLUCOSE: 99 mg/dL (ref 70–99)
HEMATOCRIT: 45 % (ref 36.0–46.0)
HEMOGLOBIN: 15.3 g/dL — AB (ref 12.0–15.0)
Potassium: 4 mEq/L (ref 3.7–5.3)
SODIUM: 139 meq/L (ref 137–147)
TCO2: 22 mmol/L (ref 0–100)

## 2014-03-12 LAB — CBC WITH DIFFERENTIAL/PLATELET
BASOS ABS: 0 10*3/uL (ref 0.0–0.1)
Basophils Relative: 1 % (ref 0–1)
Eosinophils Absolute: 0.2 10*3/uL (ref 0.0–0.7)
Eosinophils Relative: 3 % (ref 0–5)
HEMATOCRIT: 41.6 % (ref 36.0–46.0)
Hemoglobin: 13.9 g/dL (ref 12.0–15.0)
LYMPHS PCT: 36 % (ref 12–46)
Lymphs Abs: 2 10*3/uL (ref 0.7–4.0)
MCH: 31.6 pg (ref 26.0–34.0)
MCHC: 33.4 g/dL (ref 30.0–36.0)
MCV: 94.5 fL (ref 78.0–100.0)
MONO ABS: 0.4 10*3/uL (ref 0.1–1.0)
Monocytes Relative: 8 % (ref 3–12)
Neutro Abs: 3 10*3/uL (ref 1.7–7.7)
Neutrophils Relative %: 52 % (ref 43–77)
Platelets: 268 10*3/uL (ref 150–400)
RBC: 4.4 MIL/uL (ref 3.87–5.11)
RDW: 13.5 % (ref 11.5–15.5)
WBC: 5.6 10*3/uL (ref 4.0–10.5)

## 2014-03-12 LAB — POC URINE PREG, ED: Preg Test, Ur: NEGATIVE

## 2014-03-12 LAB — HIV ANTIBODY (ROUTINE TESTING W REFLEX): HIV 1&2 Ab, 4th Generation: NONREACTIVE

## 2014-03-12 LAB — WET PREP, GENITAL
Trich, Wet Prep: NONE SEEN
YEAST WET PREP: NONE SEEN

## 2014-03-12 LAB — RPR

## 2014-03-12 MED ORDER — NAPROXEN 500 MG PO TABS: 500.0000 mg | ORAL_TABLET | Freq: Two times a day (BID) | ORAL | Status: DC

## 2014-03-12 MED ORDER — KETOROLAC TROMETHAMINE 30 MG/ML IJ SOLN
30.0000 mg | Freq: Once | INTRAMUSCULAR | Status: AC
  Administered 2014-03-12: 30 mg via INTRAVENOUS
  Filled 2014-03-12: qty 1

## 2014-03-12 MED ORDER — KETOROLAC TROMETHAMINE 60 MG/2ML IM SOLN
60.0000 mg | Freq: Once | INTRAMUSCULAR | Status: DC
  Filled 2014-03-12: qty 2

## 2014-03-12 MED ORDER — METHOCARBAMOL 500 MG PO TABS: 500.0000 mg | ORAL_TABLET | Freq: Two times a day (BID) | ORAL | Status: DC

## 2014-03-12 NOTE — Discharge Instructions (Signed)
Naproxen for pain. Robaxin for spasms. Finish all cipro. Follow up with primary care doctor for recheck. Urine analysis and lab work all look good today. You will be called if your pelvic exam cultures return abnormal.   Flank Pain Flank pain refers to pain that is located on the side of the body between the upper abdomen and the back. The pain may occur over a short period of time (acute) or may be long-term or reoccurring (chronic). It may be mild or severe. Flank pain can be caused by many things. CAUSES  Some of the more common causes of flank pain include:  Muscle strains.   Muscle spasms.   A disease of your spine (vertebral disk disease).   A lung infection (pneumonia).   Fluid around your lungs (pulmonary edema).   A kidney infection.   Kidney stones.   A very painful skin rash caused by the chickenpox virus (shingles).   Gallbladder disease.  HOME CARE INSTRUCTIONS  Home care will depend on the cause of your pain. In general,  Rest as directed by your caregiver.  Drink enough fluids to keep your urine clear or pale yellow.  Only take over-the-counter or prescription medicines as directed by your caregiver. Some medicines may help relieve the pain.  Tell your caregiver about any changes in your pain.  Follow up with your caregiver as directed. SEEK IMMEDIATE MEDICAL CARE IF:   Your pain is not controlled with medicine.   You have new or worsening symptoms.  Your pain increases.   You have abdominal pain.   You have shortness of breath.   You have persistent nausea or vomiting.   You have swelling in your abdomen.   You feel faint or pass out.   You have blood in your urine.  You have a fever or persistent symptoms for more than 2-3 days.  You have a fever and your symptoms suddenly get worse. MAKE SURE YOU:   Understand these instructions.  Will watch your condition.  Will get help right away if you are not doing well or get  worse. Document Released: 05/29/2005 Document Revised: 12/31/2011 Document Reviewed: 11/20/2011 [facility] Patient Information 2015 [place], [place]. This information is not intended to replace advice given to you by your health care provider. Make sure you discuss any questions you have with your health care provider.

## 2014-03-12 NOTE — ED Notes (Signed)
Pt states went to urgent care and diagnosed with kidney infection, put on cipro started on 11/17. Pt now c/o lower right back/flank pain.
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
## 2014-03-14 LAB — GC/CHLAMYDIA PROBE AMP
CT Probe RNA: NEGATIVE
GC PROBE AMP APTIMA: NEGATIVE

## 2014-11-06 ENCOUNTER — Emergency Department: Payer: PRIVATE HEALTH INSURANCE

## 2014-11-06 ENCOUNTER — Emergency Department
Admission: EM | Admit: 2014-11-06 | Discharge: 2014-11-06 | Disposition: A | Discharge status: Home / Self Care | Payer: Self-pay | Attending: Emergency Medicine | Admitting: Emergency Medicine

## 2014-11-06 DIAGNOSIS — Z880 Allergy status to penicillin: Secondary | ICD-10-CM | POA: Insufficient documentation

## 2014-11-06 DIAGNOSIS — J159 Unspecified bacterial pneumonia: Secondary | ICD-10-CM | POA: Insufficient documentation

## 2014-11-06 DIAGNOSIS — Z79899 Other long term (current) drug therapy: Secondary | ICD-10-CM | POA: Insufficient documentation

## 2014-11-06 DIAGNOSIS — Z720 Tobacco use: Secondary | ICD-10-CM | POA: Insufficient documentation

## 2014-11-06 DIAGNOSIS — J189 Pneumonia, unspecified organism: Secondary | ICD-10-CM

## 2014-11-06 MED ORDER — IPRATROPIUM-ALBUTEROL 0.5-2.5 (3) MG/3ML IN SOLN
3.0000 mL | Freq: Once | RESPIRATORY_TRACT | Status: AC
  Administered 2014-11-06: 3 mL via RESPIRATORY_TRACT
  Filled 2014-11-06: qty 3

## 2014-11-06 MED ORDER — ALBUTEROL SULFATE HFA 108 (90 BASE) MCG/ACT IN AERS: 2.0000 | INHALATION_SPRAY | Freq: Four times a day (QID) | RESPIRATORY_TRACT | Status: DC | PRN

## 2014-11-06 MED ORDER — BENZONATATE 100 MG PO CAPS
200.0000 mg | ORAL_CAPSULE | Freq: Once | ORAL | Status: AC
  Administered 2014-11-06: 200 mg via ORAL
  Filled 2014-11-06: qty 2

## 2014-11-06 MED ORDER — AZITHROMYCIN 250 MG PO TABS: ORAL_TABLET | ORAL | Status: AC

## 2014-11-06 MED ORDER — BENZONATATE 200 MG PO CAPS: 200.0000 mg | ORAL_CAPSULE | Freq: Three times a day (TID) | ORAL | Status: DC | PRN

## 2014-11-06 NOTE — ED Provider Notes (Signed)
[facility] Emergency Department Provider Note  ____________________________________________  Time seen: Approximately 10:21 AM  I have reviewed the triage vital signs and the nursing notes.   HISTORY  Chief Complaint Cough    HPI Alexandra Holt is a 39 y.o. female complaining of cough congestion for past 3-4 days. She states she has some mild wheezing and occasional shortness of breath. Patient states she isn't coughing spells that leads to wheezing. Patient denies any other signs and symptoms upper respiratory infection. States this no pain associated with this chest discomfort. Patient denies any nausea vomiting diarrhea. Patient states she has not been able to control her wheezing and she does not have an inhaler.     Past Medical History  Diagnosis Date  . Asthma   . H/O: drug dependency   . Narcotic addiction   . Depression   . Migraine   . Anxiety     Patient Active Problem List   Diagnosis Date Noted  . Weight gain 06/15/2013  . Bronchitis 06/12/2013  . GASTROENTERITIS 07/13/2009  . 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 CELLULITIS AND ABSCESS OF FACE 01/06/2008  . ARTHRITIS 01/06/2008  . NECK PAIN 06/09/2007  . DEPRESSION 01/01/2007  . DIZZINESS OR VERTIGO 01/01/2007  . HEADACHE 01/01/2007    Past Surgical History  Procedure Laterality Date  . Ectopic pregnancy surgery    . Tooth extraction      Current Outpatient Rx  Name  Route  Sig  Dispense  Refill  . adalimumab (HUMIRA) 40 MG/0.8ML injection   Subcutaneous   Inject 40 mg into the skin every 14 (fourteen) days.          Marland Kitchen albuterol (PROVENTIL HFA;VENTOLIN HFA) 108 (90 BASE) MCG/ACT inhaler   Inhalation   Inhale 1-2 puffs into the lungs every 6 (six) hours as needed for wheezing or shortness of breath.   1 Inhaler   0   . azithromycin (ZITHROMAX Z-PAK) 250 MG tablet      Take 2 tablets (500 mg) on  Day 1,  followed by 1 tablet (250 mg) once daily on Days 2  through 5.   6 each   0   . benzonatate (TESSALON) 200 MG capsule   Oral   Take 1 capsule (200 mg total) by mouth 3 (three) times daily as needed for cough.   20 capsule   0   . Buprenorphine HCl-Naloxone HCl (SUBOXONE) 8-2 MG FILM   Sublingual   Place 0.5 Film under the tongue 3 (three) times daily.         . ciprofloxacin (CIPRO) 500 MG tablet   Oral   Take 500 mg by mouth 2 (two) times daily.      0   . FLUoxetine (PROZAC) 40 MG capsule   Oral   Take 40 mg by mouth daily.          Marland Kitchen ibuprofen (ADVIL,MOTRIN) 200 MG tablet   Oral   Take 800 mg by mouth every 6 (six) hours as needed for mild pain.         Marland Kitchen ibuprofen (ADVIL,MOTRIN) 800 MG tablet   Oral   Take 1 tablet (800 mg total) by mouth 3 (three) times daily. Take with food Patient not taking: Reported on 03/12/2014   30 tablet   0   . methocarbamol (ROBAXIN) 500 MG tablet   Oral   Take 1 tablet (500 mg total) by mouth 2 (two) times daily.   20 tablet  0   . naproxen (NAPROSYN) 500 MG tablet   Oral   Take 1 tablet (500 mg total) by mouth 2 (two) times daily.   30 tablet   0   . SUMAtriptan (IMITREX) 100 MG tablet   Oral   Take 100 mg by mouth every 2 (two) hours as needed for migraine.       4   . valACYclovir (VALTREX) 500 MG tablet   Oral   Take 500 mg by mouth as needed (break outs).       0     Allergies Penicillins and Sulfonamide derivatives  Family History  Problem Relation Age of Onset  . Diabetes Other   . Hypertension Other   . Cancer Maternal Aunt     Social History History  Substance Use Topics  . Smoking status: Current Every Day Smoker -- 1.00 packs/day for 20 years    Types: Cigarettes  . Smokeless tobacco: Not on file  . Alcohol Use: Yes     Comment: occ    Review of Systems Constitutional: No fever/chills Eyes: No visual changes. ENT: No sore throat. Cardiovascular: Denies chest pain. Respiratorycoughing spells leading to wheezing. Mild dyspnea. History  of asthma.  Gastrointestinal: No abdominal pain.  No nausea, no vomiting.  No diarrhea.  No constipation. Genitourinary: Negative for dysuria. Musculoskeletal: Negative for back pain. Skin: Negative for rash. Neurological: Negative for headaches, focal weakness or numbness. Psychiatric: Anxiety and depression  Endocrine: Hematological/Lymphatic: Allergic/Immunilogical: penicillin and sulfa  10-point ROS otherwise negative.  ____________________________________________   PHYSICAL EXAM:  VITAL SIGNS: ED Triage Vitals  Enc Vitals Group     BP 11/06/14 1011 121/62 mmHg     Pulse Rate 11/06/14 1011 62     Resp 11/06/14 1011 16     Temp 11/06/14 1011 97.5 F (36.4 C)     Temp Source 11/06/14 1011 Oral     SpO2 11/06/14 1011 98 %     Weight 11/06/14 1011 180 lb (81.647 kg)     Height 11/06/14 1011 6' (1.829 m)     Head Cir --      Peak Flow --      Pain Score --      Pain Loc --      Pain Edu? --      Excl. in GC? --     Constitutional: Alert and oriented. Well appearing and in no acute distress. Eyes: Conjunctivae are normal. PERRL. EOMI. Head: Atraumatic. Nose: No congestion/rhinnorhea. Mouth/Throat: Mucous membranes are moist.  Oropharynx non-erythematous. Neck: No stridor. No cervical spine tenderness to palpation. Hematological/Lymphatic/Immunilogical: No cervical lymphadenopathy. Cardiovascular: Normal rate, regular rhythm. Grossly normal heart sounds.  Good peripheral circulation. Respiratory: decreased respiratory effort.  No retracMild Rales. Gastrointestinal: Soft and nontender. No distention. No abdominal bruits. No CVA tenderness. Musculoskeletal: No lower extremity tenderness nor edema.  No joint effusions. Neurologic:  Normal speech and language. No gross focal neurologic deficits are appreciated. No gait instability. Skin:  Skin is warm, dry and intact. No rash noted. Psychiatric: Mood and affect are normal. Speech and behavior are  normal.  ____________________________________________   LABS (all labs ordered are listed, but only abnormal results are displayed)  Labs Reviewed - No data to display ____________________________________________  EKG   ____________________________________________  RADIOLOGY  Findings consistent with upper lobe infiltrate. I, Joni Reiningonald K Audree Schrecengost, personally viewed and evaluated these images as part of my medical decision making.   ____________________________________________   PROCEDURES  Procedure(s) performed: None  Critical Care  performed: No  ____________________________________________   INITIAL IMPRESSION / ASSESSMENT AND PLAN / ED COURSE  Pertinent labs & imaging results that were available during my care of the patient were reviewed by me and considered in my medical decision making Left upper lobe pneumonia. Patient states improve respiratory effort secondary to do her neck. Patient will be discharged with Zithromax and Tessalon Perles. [facility] advised [facility] or return back to the ER for condition worsens.  FINAL CLINICAL IMPRESSION(S) / ED DIAGNOSES  Final diagnoses:  Community acquired pneumonia      Joni Reining, PA-C 11/06/14 1127  Joni Reining, PA-C 11/06/14 1128  Jene Every, MD 11/06/14 1248

## 2014-11-06 NOTE — ED Notes (Signed)
Past 3-4 days c/o cough, dry cough,smokes, lungs clear

## 2014-11-06 NOTE — ED Notes (Signed)
Pt c/o cough with congestion for the past 3-4 days with a hx of asthma pt is in NAD.

## 2015-06-22 ENCOUNTER — Emergency Department: Payer: Self-pay

## 2015-06-22 ENCOUNTER — Emergency Department
Admission: EM | Admit: 2015-06-22 | Discharge: 2015-06-22 | Disposition: A | Discharge status: Home / Self Care | Payer: Self-pay | Attending: Emergency Medicine | Admitting: Emergency Medicine

## 2015-06-22 DIAGNOSIS — Z792 Long term (current) use of antibiotics: Secondary | ICD-10-CM | POA: Insufficient documentation

## 2015-06-22 DIAGNOSIS — Z79899 Other long term (current) drug therapy: Secondary | ICD-10-CM | POA: Insufficient documentation

## 2015-06-22 DIAGNOSIS — Y9289 Other specified places as the place of occurrence of the external cause: Secondary | ICD-10-CM | POA: Insufficient documentation

## 2015-06-22 DIAGNOSIS — S299XXA Unspecified injury of thorax, initial encounter: Secondary | ICD-10-CM | POA: Insufficient documentation

## 2015-06-22 DIAGNOSIS — M7918 Myalgia, other site: Secondary | ICD-10-CM

## 2015-06-22 DIAGNOSIS — R079 Chest pain, unspecified: Secondary | ICD-10-CM

## 2015-06-22 DIAGNOSIS — F17210 Nicotine dependence, cigarettes, uncomplicated: Secondary | ICD-10-CM | POA: Insufficient documentation

## 2015-06-22 DIAGNOSIS — Z791 Long term (current) use of non-steroidal anti-inflammatories (NSAID): Secondary | ICD-10-CM | POA: Insufficient documentation

## 2015-06-22 DIAGNOSIS — X500XXA Overexertion from strenuous movement or load, initial encounter: Secondary | ICD-10-CM | POA: Insufficient documentation

## 2015-06-22 DIAGNOSIS — Z880 Allergy status to penicillin: Secondary | ICD-10-CM | POA: Insufficient documentation

## 2015-06-22 DIAGNOSIS — Y998 Other external cause status: Secondary | ICD-10-CM | POA: Insufficient documentation

## 2015-06-22 DIAGNOSIS — Y9389 Activity, other specified: Secondary | ICD-10-CM | POA: Insufficient documentation

## 2015-06-22 DIAGNOSIS — M62830 Muscle spasm of back: Secondary | ICD-10-CM | POA: Insufficient documentation

## 2015-06-22 LAB — BASIC METABOLIC PANEL
ANION GAP: 7 (ref 5–15)
BUN: 13 mg/dL (ref 6–20)
CHLORIDE: 107 mmol/L (ref 101–111)
CO2: 26 mmol/L (ref 22–32)
Calcium: 9.3 mg/dL (ref 8.9–10.3)
Creatinine, Ser: 0.78 mg/dL (ref 0.44–1.00)
GFR calc non Af Amer: >60 mL/min (ref >60)
Glucose, Bld: 134 mg/dL — ABNORMAL HIGH (ref 65–99)
Potassium: 3.9 mmol/L (ref 3.5–5.1)
Sodium: 140 mmol/L (ref 135–145)

## 2015-06-22 LAB — CBC WITH DIFFERENTIAL/PLATELET
Basophils Absolute: 0.1 10*3/uL (ref 0–0.1)
Basophils Relative: 1 %
Eosinophils Absolute: 0.2 10*3/uL (ref 0–0.7)
Eosinophils Relative: 4 %
HCT: 44.8 % (ref 35.0–47.0)
HEMOGLOBIN: 15.3 g/dL (ref 12.0–16.0)
LYMPHS ABS: 2.1 10*3/uL (ref 1.0–3.6)
LYMPHS PCT: 37 %
MCH: 32.4 pg (ref 26.0–34.0)
MCHC: 34.2 g/dL (ref 32.0–36.0)
MCV: 94.7 fL (ref 80.0–100.0)
Monocytes Absolute: 0.4 10*3/uL (ref 0.2–0.9)
Monocytes Relative: 6 %
NEUTROS ABS: 3 10*3/uL (ref 1.4–6.5)
NEUTROS PCT: 52 %
Platelets: 232 10*3/uL (ref 150–440)
RBC: 4.73 MIL/uL (ref 3.80–5.20)
RDW: 13.6 % (ref 11.5–14.5)
WBC: 5.7 10*3/uL (ref 3.6–11.0)

## 2015-06-22 LAB — TROPONIN I: Troponin I: <0.03 ng/mL (ref <0.031)

## 2015-06-22 MED ORDER — IBUPROFEN 600 MG PO TABS: 600.0000 mg | ORAL_TABLET | Freq: Three times a day (TID) | ORAL | Status: DC | PRN

## 2015-06-22 MED ORDER — CYCLOBENZAPRINE HCL 10 MG PO TABS
10.0000 mg | ORAL_TABLET | Freq: Once | ORAL | Status: AC
  Administered 2015-06-22: 10 mg via ORAL
  Filled 2015-06-22: qty 1

## 2015-06-22 MED ORDER — CYCLOBENZAPRINE HCL 10 MG PO TABS: 10.0000 mg | ORAL_TABLET | Freq: Three times a day (TID) | ORAL | Status: DC | PRN

## 2015-06-22 MED ORDER — IBUPROFEN 600 MG PO TABS
600.0000 mg | ORAL_TABLET | Freq: Once | ORAL | Status: AC
  Administered 2015-06-22: 600 mg via ORAL
  Filled 2015-06-22: qty 1

## 2015-06-22 NOTE — ED Provider Notes (Signed)
[facility] Emergency Department Provider Note   ____________________________________________  Time seen:  I have reviewed the triage vital signs and the triage nursing note.  HISTORY  Chief Complaint Chest Pain   Historian Patient  HPI Alexandra Holt is a 40 y.o. female is here with complaint of right side back pain, located next to the spine in the shoulder blade. No known traumatic injury. 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  She's had some pain that goes across the right shoulder and to the right chest wall. No coronary artery disease history. She does have a history of smoking.  Pain is moderate to severe at times.    Past Medical History  Diagnosis Date  . Asthma   . H/O: drug dependency (HCC)   . Narcotic addiction (HCC)   . Depression   . Migraine   . Anxiety     Patient Active Problem List   Diagnosis Date Noted  . Weight gain 06/15/2013  . Bronchitis 06/12/2013  . GASTROENTERITIS 07/13/2009  . 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 CELLULITIS AND ABSCESS OF FACE 01/06/2008  . ARTHRITIS 01/06/2008  . NECK PAIN 06/09/2007  . DEPRESSION 01/01/2007  . DIZZINESS OR VERTIGO 01/01/2007  . HEADACHE 01/01/2007    Past Surgical History  Procedure Laterality Date  . Ectopic pregnancy surgery    . Tooth extraction      Current Outpatient Rx  Name  Route  Sig  Dispense  Refill  . adalimumab (HUMIRA) 40 MG/0.8ML injection   Subcutaneous   Inject 40 mg into the skin every 14 (fourteen) days.          Marland Kitchen albuterol (PROVENTIL HFA;VENTOLIN HFA) 108 (90 BASE) MCG/ACT inhaler   Inhalation   Inhale 1-2 puffs into the lungs every 6 (six) hours as needed for wheezing or shortness of breath.   1 Inhaler   0   . albuterol (PROVENTIL HFA;VENTOLIN HFA) 108 (90 BASE) MCG/ACT inhaler   Inhalation   Inhale 2 puffs into the lungs every 6 (six) hours as needed for wheezing or shortness of  breath.   1 Inhaler   2   . benzonatate (TESSALON) 200 MG capsule   Oral   Take 1 capsule (200 mg total) by mouth 3 (three) times daily as needed for cough.   20 capsule   0   . Buprenorphine HCl-Naloxone HCl (SUBOXONE) 8-2 MG FILM   Sublingual   Place 0.5 Film under the tongue 3 (three) times daily.         . ciprofloxacin (CIPRO) 500 MG tablet   Oral   Take 500 mg by mouth 2 (two) times daily.      0   . cyclobenzaprine (FLEXERIL) 10 MG tablet   Oral   Take 1 tablet (10 mg total) by mouth every 8 (eight) hours as needed for muscle spasms.   20 tablet   0   . FLUoxetine (PROZAC) 40 MG capsule   Oral   Take 40 mg by mouth daily.          Marland Kitchen ibuprofen (ADVIL,MOTRIN) 600 MG tablet   Oral   Take 1 tablet (600 mg total) by mouth every 8 (eight) hours as needed.   20 tablet   0   . methocarbamol (ROBAXIN) 500 MG tablet   Oral   Take 1 tablet (500 mg total) by mouth 2 (two) times daily.   20 tablet  0   . naproxen (NAPROSYN) 500 MG tablet   Oral   Take 1 tablet (500 mg total) by mouth 2 (two) times daily.   30 tablet   0   . SUMAtriptan (IMITREX) 100 MG tablet   Oral   Take 100 mg by mouth every 2 (two) hours as needed for migraine.       4   . valACYclovir (VALTREX) 500 MG tablet   Oral   Take 500 mg by mouth as needed (break outs).       0     Allergies Penicillins and Sulfonamide derivatives  Family History  Problem Relation Age of Onset  . Diabetes Other   . Hypertension Other   . Cancer Maternal Aunt     Social History Social History  Substance Use Topics  . Smoking status: Current Every Day Smoker -- 1.00 packs/day for 20 years    Types: Cigarettes  . Smokeless tobacco: None  . Alcohol Use: Yes     Comment: occ    Review of Systems  Constitutional: Negative for fever. Eyes: Negative for visual changes. ENT: Negative for sore throat. Cardiovascular: No pleuritic chest pain. Respiratory: Negative for shortness of  breath. Gastrointestinal: Negative for abdominal pain, vomiting and diarrhea. Genitourinary: Negative for dysuria. Musculoskeletal: Negative for spine pain Skin: Negative for rash. Neurological: Negative for headache. 10 point Review of Systems otherwise negative ____________________________________________   PHYSICAL EXAM:  VITAL SIGNS: ED Triage Vitals  Enc Vitals Group     BP 06/22/15 1127 135/68 mmHg     Pulse Rate 06/22/15 1127 86     Resp 06/22/15 1127 17     Temp 06/22/15 1127 97.5 F (36.4 C)     Temp Source 06/22/15 1127 Oral     SpO2 06/22/15 1127 100 %     Weight 06/22/15 1127 180 lb (81.647 kg)     Height 06/22/15 1127 6' (1.829 m)     Head Cir --      Peak Flow --      Pain Score 06/22/15 1127 8     Pain Loc --      Pain Edu? --      Excl. in GC? --      Constitutional: Alert and oriented. Well appearing and in no distress. HEENT   Head: Normocephalic and atraumatic.      Eyes: Conjunctivae are normal. PERRL. Normal extraocular movements.      Ears:         Nose: No congestion/rhinnorhea.   Mouth/Throat: Mucous membranes are moist.   Neck: No stridor. Cardiovascular/Chest: Normal rate, regular rhythm.  No murmurs, rubs, or gallops. Respiratory: Normal respiratory effort without tachypnea nor retractions. Breath sounds are clear and equal bilaterally. No wheezes/rales/rhonchi. Gastrointestinal: Soft. No distention, no guarding, no rebound. Nontender.    Genitourinary/rectal:Deferred Musculoskeletal: Palpable muscle spasm medial shoulder blade border, tender to palpation. Nontender with normal range of motion in all extremities. No joint effusions.  No lower extremity tenderness.  No edema. Neurologic:  Normal speech and language. No gross or focal neurologic deficits are appreciated. Skin:  Skin is warm, dry and intact. No rash noted. Psychiatric: Mood and affect are normal. Speech and behavior are normal. Patient exhibits appropriate insight and  judgment.  ____________________________________________   EKG I, Governor Rooks, MD, the attending physician have personally viewed and interpreted all ECGs.  70 bpm. Normal sinus rhythm. Narrow QRS. Normal axis. Normal ST and T-wave. There is a Q waves septally ____________________________________________  LABS (pertinent positives/negatives)  Basic metabolic panel within normal limits Troponin less than 0.0 CBC within normal limits  ____________________________________________  RADIOLOGY All Xrays were viewed by me. Imaging interpreted by Radiologist.  Chest x-ray two-view: No edema or consolidation __________________________________________  PROCEDURES  Procedure(s) performed: None  Critical Care performed: None  ____________________________________________   ED COURSE / ASSESSMENT AND PLAN  Pertinent labs & imaging results that were available during my care of the patient were reviewed by me and considered in my medical decision making (see chart for details).   Patient hurts where she has a palpable muscle spasm. We discussed heat, massage, anti-factors, and muscle relaxer.  I think that the right-sided sharp shoulder/chest discomfort is musculoskeletal as well. EKG and labs are reassuring.    CONSULTATIONS:   None   Patient / Family / Caregiver informed of clinical course, medical decision-making process, and agree with plan.   I discussed return precautions, follow-up instructions, and discharged instructions with patient and/or family.   ___________________________________________   FINAL CLINICAL IMPRESSION(S) / ED DIAGNOSES   Final diagnoses:  Back muscle spasm  Musculoskeletal pain  Nonspecific chest pain              Note: This dictation was prepared with Dragon dictation. Any transcriptional errors that result from this process are unintentional   Governor Rooksebecca Radames Mejorado, MD 06/22/15 1349

## 2015-06-22 NOTE — Discharge Instructions (Signed)
You were evaluated for right-sided back pain with some pain into the right chest. Exam and evaluation are reassuring. You have a muscle spasm of the right back, and we discussed massage, heat, and anti-inflammatory ibuprofen and muscle relaxer Flexeril.  Follow-up with the primary care physician. The right chest discomfort I suspect is likely musculoskeletal as well, however I will recommend you follow-up with a cardiologist, and Dr. Milta Deiters office number is provided for next week.   Musculoskeletal Pain Musculoskeletal pain is muscle and boney aches and pains. These pains can occur in any part of the body. Your caregiver may treat you without knowing the cause of the pain. They may treat you if blood or urine tests, X-rays, and other tests were normal.  CAUSES There is often not a definite cause or reason for these pains. These pains may be caused by a type of germ (virus). The discomfort may also come from overuse. Overuse includes working out too hard when your body is not fit. Boney aches also come from weather changes. Bone is sensitive to atmospheric pressure changes. HOME CARE INSTRUCTIONS   Ask when your test results will be ready. Make sure you get your test results.  Only take over-the-counter or prescription medicines for pain, discomfort, or fever as directed by your caregiver. If you were given medications for your condition, do not drive, operate machinery or power tools, or sign legal documents for 24 hours. Do not drink alcohol. Do not take sleeping pills or other medications that may interfere with treatment.  Continue all activities unless the activities cause more pain. When the pain lessens, slowly resume normal activities. Gradually increase the intensity and duration of the activities or exercise.  During periods of severe pain, bed rest may be helpful. Lay or sit in any position that is comfortable.  Putting ice on the injured area.  Put ice in a bag.  Place a towel  between your skin and the bag.  Leave the ice on for 15 to 20 minutes, 3 to 4 times a day.  Follow up with your caregiver for continued problems and no reason can be found for the pain. If the pain becomes worse or does not go away, it may be necessary to repeat tests or do additional testing. Your caregiver may need to look further for a possible cause. SEEK IMMEDIATE MEDICAL CARE IF:  You have pain that is getting worse and is not relieved by medications.  You develop chest pain that is associated with shortness or breath, sweating, feeling sick to your stomach (nauseous), or throw up (vomit).  Your pain becomes localized to the abdomen.  You develop any new symptoms that seem different or that concern you. MAKE SURE YOU:   Understand these instructions.  Will watch your condition.  Will get help right away if you are not doing well or get worse.   This information is not intended to replace advice given to you by your health care provider. Make sure you discuss any questions you have with your health care provider.   Document Released: 04/07/2005 Document Revised: 06/30/2011 Document Reviewed: 12/10/2012 Elsevier Interactive Patient Education 2016 [employer]  Nonspecific Chest Pain It is often hard to find the cause of chest pain. There is always a chance that your pain could be related to something serious, such as a heart attack or a blood clot in your lungs. Chest pain can also be caused by conditions that are not life-threatening. If you have chest pain, it  is very important to follow up with your doctor.  HOME CARE  If you were prescribed an antibiotic medicine, finish it all even if you start to feel better.  Avoid any activities that cause chest pain.  Do not use any tobacco products, including cigarettes, chewing tobacco, or electronic cigarettes. If you need help quitting, ask your doctor.  Do not drink alcohol.  Take medicines only as told by your  doctor.  Keep all follow-up visits as told by your doctor. This is important. This includes any further testing if your chest pain does not go away.  Your doctor may tell you to keep your head raised (elevated) while you sleep.  Make lifestyle changes as told by your doctor. These may include:  Getting regular exercise. Ask your doctor to suggest some activities that are safe for you.  Eating a heart-healthy diet. Your doctor or a diet specialist (dietitian) can help you to learn healthy eating options.  Maintaining a healthy weight.  Managing diabetes, if necessary.  Reducing stress. GET HELP IF:  Your chest pain does not go away, even after treatment.  You have a rash with blisters on your chest.  You have a fever. GET HELP RIGHT AWAY IF:  Your chest pain is worse.  You have an increasing cough, or you cough up blood.  You have severe belly (abdominal) pain.  You feel extremely weak.  You pass out (faint).  You have chills.  You have sudden, unexplained chest discomfort.  You have sudden, unexplained discomfort in your arms, back, neck, or jaw.  You have shortness of breath at any time.  You suddenly start to sweat, or your skin gets clammy.  You feel nauseous.  You vomit.  You suddenly feel light-headed or dizzy.  Your heart begins to beat quickly, or it feels like it is skipping beats. These symptoms may be an emergency. Do not wait to see if the symptoms will go away. Get medical help right away. Call your local emergency services (911 in the [place] Do not drive yourself to the hospital.   This information is not intended to replace advice given to you by your health care provider. Make sure you discuss any questions you have with your health care provider.   Document Released: 09/24/2007 Document Revised: 04/28/2014 Document Reviewed: 11/11/2013 Elsevier Interactive Patient Education [employer] Inc[employer]

## 2015-06-22 NOTE — ED Notes (Signed)
Pt c/o constant sharp pain central chest that radiates into her back for the past week, states worse with deep breathing.. Pt is in NAD at present, respirations WNL, skin is warm and dry..Marland Kitchen

## 2015-07-19 ENCOUNTER — Emergency Department
Admission: EM | Admit: 2015-07-19 | Discharge: 2015-07-19 | Disposition: A | Discharge status: Home / Self Care | Payer: PRIVATE HEALTH INSURANCE | Attending: Emergency Medicine | Admitting: Emergency Medicine

## 2015-07-19 ENCOUNTER — Encounter: Payer: Self-pay | Admitting: Emergency Medicine

## 2015-07-19 DIAGNOSIS — G43009 Migraine without aura, not intractable, without status migrainosus: Secondary | ICD-10-CM

## 2015-07-19 DIAGNOSIS — Z880 Allergy status to penicillin: Secondary | ICD-10-CM | POA: Insufficient documentation

## 2015-07-19 DIAGNOSIS — Z79899 Other long term (current) drug therapy: Secondary | ICD-10-CM | POA: Insufficient documentation

## 2015-07-19 DIAGNOSIS — G43909 Migraine, unspecified, not intractable, without status migrainosus: Secondary | ICD-10-CM | POA: Insufficient documentation

## 2015-07-19 DIAGNOSIS — F17210 Nicotine dependence, cigarettes, uncomplicated: Secondary | ICD-10-CM | POA: Insufficient documentation

## 2015-07-19 MED ORDER — DEXAMETHASONE SODIUM PHOSPHATE 10 MG/ML IJ SOLN
10.0000 mg | Freq: Once | INTRAMUSCULAR | Status: AC
  Administered 2015-07-19: 10 mg via INTRAVENOUS
  Filled 2015-07-19: qty 1

## 2015-07-19 MED ORDER — METOCLOPRAMIDE HCL 5 MG/ML IJ SOLN
10.0000 mg | Freq: Once | INTRAMUSCULAR | Status: AC
  Administered 2015-07-19: 10 mg via INTRAVENOUS
  Filled 2015-07-19: qty 2

## 2015-07-19 MED ORDER — SODIUM CHLORIDE 0.9 % IV BOLUS (SEPSIS)
1000.0000 mL | Freq: Once | INTRAVENOUS | Status: AC
  Administered 2015-07-19: 1000 mL via INTRAVENOUS

## 2015-07-19 MED ORDER — DIPHENHYDRAMINE HCL 50 MG/ML IJ SOLN
25.0000 mg | Freq: Once | INTRAMUSCULAR | Status: AC
  Administered 2015-07-19: 25 mg via INTRAVENOUS
  Filled 2015-07-19: qty 1

## 2015-07-19 MED ORDER — KETOROLAC TROMETHAMINE 30 MG/ML IJ SOLN
30.0000 mg | Freq: Once | INTRAMUSCULAR | Status: AC
  Administered 2015-07-19: 30 mg via INTRAVENOUS
  Filled 2015-07-19: qty 1

## 2015-07-19 NOTE — ED Provider Notes (Signed)
[facility] Emergency Department Provider Note   ____________________________________________  Time seen: Approximately 7:45 AM I have reviewed the triage vital signs and the triage nursing note.  HISTORY  Chief Complaint Headache   Historian Patient  HPI Alexandra Holt is a 40 y.o. female with a history of prior migraines, but states the last one this bad was about a year ago. She states she was previously diagnosed with migraines with a neurologist and had previously had a CAT scan performed.  This headache is in the same location right frontal and behind the eye and right side, with nausea, and some sound and light sensitivity.  No focal weakness or numbness, confusion or altered mental status.  She is recently gotten over an upper respiratory infection with some subjective fevers, but no fevers in the last 2 days. She's had some muscular neck pain, in the past. No neck stiffness. No vision changes. Next the next line she tried over-the-counter Benadryl yesterday with some minor improvement, but the headache was back this morning.  Symptoms are moderate.    Past Medical History  Diagnosis Date  . Asthma   . H/O: drug dependency (HCC)   . Narcotic addiction (HCC)   . Depression   . Migraine   . Anxiety     Patient Active Problem List   Diagnosis Date Noted  . Weight gain 06/15/2013  . Bronchitis 06/12/2013  . GASTROENTERITIS 07/13/2009  . 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 CELLULITIS AND ABSCESS OF FACE 01/06/2008  . ARTHRITIS 01/06/2008  . NECK PAIN 06/09/2007  . DEPRESSION 01/01/2007  . DIZZINESS OR VERTIGO 01/01/2007  . HEADACHE 01/01/2007    Past Surgical History  Procedure Laterality Date  . Ectopic pregnancy surgery    . Tooth extraction      Current Outpatient Rx  Name  Route  Sig  Dispense  Refill  . adalimumab (HUMIRA) 40 MG/0.8ML injection   Subcutaneous   Inject 40 mg into the skin every 14 (fourteen) days.           Marland Kitchen. albuterol (PROVENTIL HFA;VENTOLIN HFA) 108 (90 BASE) MCG/ACT inhaler   Inhalation   Inhale 2 puffs into the lungs every 6 (six) hours as needed for wheezing or shortness of breath.   1 Inhaler   2   . Buprenorphine HCl-Naloxone HCl 4-1 MG FILM   Sublingual   Place 1 Film under the tongue 2 (two) times daily.         . cyclobenzaprine (FLEXERIL) 10 MG tablet   Oral   Take 1 tablet (10 mg total) by mouth every 8 (eight) hours as needed for muscle spasms.   20 tablet   0   . FLUoxetine (PROZAC) 40 MG capsule   Oral   Take 40 mg by mouth daily.          Marland Kitchen. ibuprofen (ADVIL,MOTRIN) 600 MG tablet   Oral   Take 1 tablet (600 mg total) by mouth every 8 (eight) hours as needed. Patient taking differently: Take 600 mg by mouth every 8 (eight) hours as needed for headache or moderate pain.    20 tablet   0   . valACYclovir (VALTREX) 500 MG tablet   Oral   Take 500 mg by mouth as needed (for flare-ups).       0     Allergies Penicillins and Sulfonamide derivatives  Family History  Problem Relation Age of Onset  . Diabetes Other   . Hypertension Other   .  Cancer Maternal Aunt     Social History Social History  Substance Use Topics  . Smoking status: Current Every Day Smoker -- 1.00 packs/day for 20 years    Types: Cigarettes  . Smokeless tobacco: None  . Alcohol Use: Yes     Comment: occ    Review of Systems  Constitutional: Subjective fevers last week. Eyes: Negative for visual changes. Mild photophobia ENT: Recent nasal congestion. Cardiovascular: Negative for chest pain. Respiratory: Negative for shortness of breath. Gastrointestinal: Negative for abdominal pain, vomiting and diarrhea. Genitourinary: Negative for dysuria. Musculoskeletal: Negative for back pain. Skin: Negative for rash. Neurological: Positive for headache. 10 point Review of Systems otherwise negative ____________________________________________   PHYSICAL EXAM:  VITAL  SIGNS: ED Triage Vitals  Enc Vitals Group     BP 07/19/15 0505 155/87 mmHg     Pulse Rate 07/19/15 0505 110     Resp 07/19/15 0505 18     Temp 07/19/15 0505 98 F (36.7 C)     Temp Source 07/19/15 0505 Oral     SpO2 07/19/15 0505 98 %     Weight 07/19/15 0505 170 lb (77.111 kg)     Height 07/19/15 0505 6' (1.829 m)     Head Cir --      Peak Flow --      Pain Score 07/19/15 0504 10     Pain Loc --      Pain Edu? --      Excl. in GC? --      Constitutional: Alert and oriented. Well appearing and in no distress. HEENT   Head: Normocephalic and atraumatic.      Eyes: Conjunctivae are normal. PERRL. Normal extraocular movements.Funduscopic exam normal bilaterally.      Ears:         Nose: No congestion/rhinnorhea.   Mouth/Throat: Mucous membranes are moist.   Neck: No stridor. Muscles of the neck. No stiff neck. Cardiovascular/Chest: Normal rate, regular rhythm.  No murmurs, rubs, or gallops. Respiratory: Normal respiratory effort without tachypnea nor retractions. Breath sounds are clear and equal bilaterally. No wheezes/rales/rhonchi. Gastrointestinal: Soft. No distention, no guarding, no rebound. Nontender.    Genitourinary/rectal:Deferred Musculoskeletal: Nontender with normal range of motion in all extremities. No joint effusions.  No lower extremity tenderness.  No edema. Neurologic:  Normal speech and language. No gross or focal neurologic deficits are appreciated. Skin:  Skin is warm, dry and intact. No rash noted. Psychiatric: Mood and affect are normal. Speech and behavior are normal. Patient exhibits appropriate insight and judgment.  ____________________________________________   EKG I, Governor Rooks, MD, the attending physician have personally viewed and interpreted all ECGs.  None ____________________________________________  LABS (pertinent positives/negatives)  none  ____________________________________________  RADIOLOGY All Xrays were viewed  by me. Imaging interpreted by Radiologist.  None __________________________________________  PROCEDURES  Procedure(s) performed: None  Critical Care performed: None  ____________________________________________   ED COURSE / ASSESSMENT AND PLAN  Pertinent labs & imaging results that were available during my care of the patient were reviewed by me and considered in my medical decision making (see chart for details).  This patient is here for her headache is consistent with her prior migraines. Nonfocal neurologic exam, reassuring history and exam for no intracranial emergency headache. I am not recommending neuroimaging at this point time.  I will treat her with migraine cocktail including IV fluids, Toradol, Reglan and Benadryl. I'm adding dexamethasone to help prevent rebound/recurrent migraine.   On reexamination around 10:30 AM, patient was resting  comfortably and easily awakened. She states that her headache was down to a mild pressure on the pain scale 1-2 out of 10.  She did ask for a Z-Pak to help "clear things up," but I discussed with her that based on her lung exam and no hypoxia, I do not suspect a bacterial illness, and suspect that she is improving after a viral URI per her own history, and that we should hold off on any antibiotics at this point in time.  She is to follow-up with her primary care physician.  CONSULTATIONS:   None   Patient / Family / Caregiver informed of clinical course, medical decision-making process, and agree with plan.   I discussed return precautions, follow-up instructions, and discharged instructions with patient and/or family.   ___________________________________________   FINAL CLINICAL IMPRESSION(S) / ED DIAGNOSES   Final diagnoses:  Nonintractable migraine, unspecified migraine type              Note: This dictation was prepared with Dragon dictation. Any transcriptional errors that result from this process are  unintentional   Governor Rooks, MD 07/19/15 1050

## 2015-07-19 NOTE — Discharge Instructions (Signed)
You were evaluated for headache typical of your prior migraines. You were treated in the emergency department, and your exam and evaluation were reassuring.  Return to the emergency department for any new or worsening condition including worsening pain, confusion or altered mental status, vision changes, vomiting, weakness or numbness, fever, or any other symptoms concerning to you.   Migraine Headache A migraine headache is an intense, throbbing pain on one or both sides of your head. A migraine can last for 30 minutes to several hours. CAUSES  The exact cause of a migraine headache is not always known. However, a migraine may be caused when nerves in the brain become irritated and release chemicals that cause inflammation. This causes pain. Certain things may also trigger migraines, such as:  Alcohol.  Smoking.  Stress.  Menstruation.  Aged cheeses.  Foods or drinks that contain nitrates, glutamate, aspartame, or tyramine.  Lack of sleep.  Chocolate.  Caffeine.  Hunger.  Physical exertion.  Fatigue.  Medicines used to treat chest pain (nitroglycerine), birth control pills, estrogen, and some blood pressure medicines. SIGNS AND SYMPTOMS  Pain on one or both sides of your head.  Pulsating or throbbing pain.  Severe pain that prevents daily activities.  Pain that is aggravated by any physical activity.  Nausea, vomiting, or both.  Dizziness.  Pain with exposure to bright lights, loud noises, or activity.  General sensitivity to bright lights, loud noises, or smells. Before you get a migraine, you may get warning signs that a migraine is coming (aura). An aura may include:  Seeing flashing lights.  Seeing bright spots, halos, or zigzag lines.  Having tunnel vision or blurred vision.  Having feelings of numbness or tingling.  Having trouble talking.  Having muscle weakness. DIAGNOSIS  A migraine headache is often diagnosed based  on:  Symptoms.  Physical exam.  A CT scan or MRI of your head. These imaging tests cannot diagnose migraines, but they can help rule out other causes of headaches. TREATMENT Medicines may be given for pain and nausea. Medicines can also be given to help prevent recurrent migraines.  HOME CARE INSTRUCTIONS  Only take over-the-counter or prescription medicines for pain or discomfort as directed by your health care provider. The use of long-term narcotics is not recommended.  Lie down in a dark, quiet room when you have a migraine.  Keep a journal to find out what may trigger your migraine headaches. For example, write down:  What you eat and drink.  How much sleep you get.  Any change to your diet or medicines.  Limit alcohol consumption.  Quit smoking if you smoke.  Get 7-9 hours of sleep, or as recommended by your health care provider.  Limit stress.  Keep lights dim if bright lights bother you and make your migraines worse. SEEK IMMEDIATE MEDICAL CARE IF:   Your migraine becomes severe.  You have a fever.  You have a stiff neck.  You have vision loss.  You have muscular weakness or loss of muscle control.  You start losing your balance or have trouble walking.  You feel faint or pass out.  You have severe symptoms that are different from your first symptoms. MAKE SURE YOU:   Understand these instructions.  Will watch your condition.  Will get help right away if you are not doing well or get worse.   This information is not intended to replace advice given to you by your health care provider. Make sure you discuss any questions  you have with your health care provider.   Document Released: 04/07/2005 Document Revised: 04/28/2014 Document Reviewed: 12/13/2012 Elsevier Interactive Patient Education [employer]

## 2015-07-19 NOTE — ED Notes (Signed)
Patient ambulatory to triage with steady gait, without difficulty or distress noted; pt reports right sided HA x 2 days accomp by nausea; st hx migraines

## 2015-07-22 ENCOUNTER — Encounter: Payer: Self-pay | Admitting: Emergency Medicine

## 2015-07-22 ENCOUNTER — Emergency Department
Admission: EM | Admit: 2015-07-22 | Discharge: 2015-07-22 | Disposition: A | Discharge status: Home / Self Care | Payer: Self-pay | Attending: Emergency Medicine | Admitting: Emergency Medicine

## 2015-07-22 ENCOUNTER — Emergency Department: Payer: Self-pay

## 2015-07-22 DIAGNOSIS — J45909 Unspecified asthma, uncomplicated: Secondary | ICD-10-CM | POA: Insufficient documentation

## 2015-07-22 DIAGNOSIS — F329 Major depressive disorder, single episode, unspecified: Secondary | ICD-10-CM | POA: Insufficient documentation

## 2015-07-22 DIAGNOSIS — J069 Acute upper respiratory infection, unspecified: Secondary | ICD-10-CM

## 2015-07-22 DIAGNOSIS — Z79899 Other long term (current) drug therapy: Secondary | ICD-10-CM | POA: Insufficient documentation

## 2015-07-22 DIAGNOSIS — F17210 Nicotine dependence, cigarettes, uncomplicated: Secondary | ICD-10-CM

## 2015-07-22 DIAGNOSIS — J209 Acute bronchitis, unspecified: Secondary | ICD-10-CM

## 2015-07-22 MED ORDER — PREDNISONE 10 MG PO TABS: ORAL_TABLET | ORAL | Status: DC

## 2015-07-22 MED ORDER — GUAIFENESIN-CODEINE 100-10 MG/5ML PO SOLN: 5.0000 mL | ORAL | Status: DC | PRN

## 2015-07-22 MED ORDER — IPRATROPIUM-ALBUTEROL 0.5-2.5 (3) MG/3ML IN SOLN
3.0000 mL | Freq: Once | RESPIRATORY_TRACT | Status: AC
  Administered 2015-07-22: 3 mL via RESPIRATORY_TRACT
  Filled 2015-07-22: qty 3

## 2015-07-22 MED ORDER — ALBUTEROL SULFATE HFA 108 (90 BASE) MCG/ACT IN AERS: 2.0000 | INHALATION_SPRAY | Freq: Four times a day (QID) | RESPIRATORY_TRACT | Status: DC | PRN

## 2015-07-22 NOTE — ED Provider Notes (Signed)
[facility] Emergency Department Provider Note  ____________________________________________  Time seen: Approximately 12:22 PM  I have reviewed the triage vital signs and the nursing notes.   HISTORY  Chief Complaint Cough   HPI Alexandra Holt is a 40 y.o. female unproductive cough for 2 weeks. Patient states that she woke up this morning and felt like she couldn't "catch her breath" while she was coughing. She states she tried her inhaler but didn't feel like it helped.She also complains of her ribs hurting because of the amount of coughing that she is doing. Patient is unaware of any fever or chills. She states she has not taken any over-the-counter medication for her cough. She was seen in the emergency room on 3/30 at which time she was being seen for a migraine but also was evaluated for cough. At that time she was told it was mostly viral. She denies any pain at this time. Patient is a smoker and continues to smoke at this time.   Past Medical History  Diagnosis Date  . Asthma   . H/O: drug dependency (HCC)   . Narcotic addiction (HCC)   . Depression   . Migraine   . Anxiety     Patient Active Problem List   Diagnosis Date Noted  . Weight gain 06/15/2013  . Bronchitis 06/12/2013  . GASTROENTERITIS 07/13/2009  . 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 CELLULITIS AND ABSCESS OF FACE 01/06/2008  . ARTHRITIS 01/06/2008  . NECK PAIN 06/09/2007  . DEPRESSION 01/01/2007  . DIZZINESS OR VERTIGO 01/01/2007  . HEADACHE 01/01/2007    Past Surgical History  Procedure Laterality Date  . Ectopic pregnancy surgery    . Tooth extraction      Current Outpatient Rx  Name  Route  Sig  Dispense  Refill  . adalimumab (HUMIRA) 40 MG/0.8ML injection   Subcutaneous   Inject 40 mg into the skin every 14 (fourteen) days.          Marland Kitchen. albuterol (PROVENTIL HFA;VENTOLIN HFA) 108 (90 Base) MCG/ACT inhaler   Inhalation   Inhale 2 puffs into the lungs  every 6 (six) hours as needed for wheezing or shortness of breath.   1 Inhaler   2   . Buprenorphine HCl-Naloxone HCl 4-1 MG FILM   Sublingual   Place 1 Film under the tongue 2 (two) times daily.         . cyclobenzaprine (FLEXERIL) 10 MG tablet   Oral   Take 1 tablet (10 mg total) by mouth every 8 (eight) hours as needed for muscle spasms.   20 tablet   0   . FLUoxetine (PROZAC) 40 MG capsule   Oral   Take 40 mg by mouth daily.          Marland Kitchen. guaiFENesin-codeine 100-10 MG/5ML syrup   Oral   Take 5 mLs by mouth every 4 (four) hours as needed.   120 mL   0   . ibuprofen (ADVIL,MOTRIN) 600 MG tablet   Oral   Take 1 tablet (600 mg total) by mouth every 8 (eight) hours as needed. Patient taking differently: Take 600 mg by mouth every 8 (eight) hours as needed for headache or moderate pain.    20 tablet   0   . predniSONE (DELTASONE) 10 MG tablet      Take 6 tablets  today, on day 2 take 5 tablets, day 3 take 4 tablets, day 4 take 3 tablets, day 5 take  2 tablets  and 1 tablet the last day   21 tablet   0   . valACYclovir (VALTREX) 500 MG tablet   Oral   Take 500 mg by mouth as needed (for flare-ups).       0     Allergies Penicillins and Sulfonamide derivatives  Family History  Problem Relation Age of Onset  . Diabetes Other   . Hypertension Other   . Cancer Maternal Aunt     Social History Social History  Substance Use Topics  . Smoking status: Current Every Day Smoker -- 1.00 packs/day for 20 years    Types: Cigarettes  . Smokeless tobacco: None  . Alcohol Use: Yes     Comment: occ    Review of Systems Constitutional: No fever/chills ENT: No sore throat. Cardiovascular: Denies chest pain. Respiratory: Denies shortness of breath. Gastrointestinal:  No nausea, no vomiting.  Genitourinary: Negative for dysuria. Musculoskeletal: Negative for back pain. Positive for rib pain. Skin: Negative for rash. Neurological: Positive for migraine headaches, no  focal weakness or numbness.  10-point ROS otherwise negative.  ____________________________________________   PHYSICAL EXAM:  VITAL SIGNS: ED Triage Vitals  Enc Vitals Group     BP 07/22/15 1148 111/57 mmHg     Pulse Rate 07/22/15 1148 69     Resp 07/22/15 1148 18     Temp 07/22/15 1148 97.5 F (36.4 C)     Temp Source 07/22/15 1148 Oral     SpO2 07/22/15 1148 96 %     Weight 07/22/15 1148 170 lb (77.111 kg)     Height 07/22/15 1148  (1.778 m)     Head Cir --      Peak Flow --      Pain Score 07/22/15 1202 0     Pain Loc --      Pain Edu? --      Excl. in GC? --     Constitutional: Alert and oriented. Well appearing and in no acute distress. Eyes: Conjunctivae are normal. PERRL. EOMI. Head: Atraumatic. Nose: No congestion/rhinnorhea.  Mouth/Throat: Mucous membranes are moist.  Oropharynx non-erythematous. Neck: No stridor.   Hematological/Lymphatic/Immunilogical: No cervical lymphadenopathy. Cardiovascular: Normal rate, regular rhythm. Grossly normal heart sounds.  Good peripheral circulation. Respiratory: Normal respiratory effort.  No retractions. Lungs CTAB. Nonproductive cough without wheezing. Musculoskeletal: His upper and lower extremities without any difficulty. Neurologic:  Normal speech and language. No gross focal neurologic deficits are appreciated. No gait instability. Skin:  Skin is warm, dry and intact. No rash noted. Psychiatric: Mood and affect are normal. Speech and behavior are normal.  ____________________________________________   LABS (all labs ordered are listed, but only abnormal results are displayed)  Labs Reviewed - No data to display RADIOLOGY  Chest x-ray per radiologist showed no evidence of pneumonia or pulmonary disease. ____________________________________________   PROCEDURES  Procedure(s) performed: None  Critical Care performed: No  ____________________________________________   INITIAL IMPRESSION / ASSESSMENT  AND PLAN / ED COURSE  Pertinent labs & imaging results that were available during my care of the patient were reviewed by me and considered in my medical decision making (see chart for details).  After DuoNeb. Patient states that she was breathing much better and had not had any more coughing. Patient actually admits now that she had been using her mother's inhaler. Prescription was written for Proventil inhaler, prednisone 6 day taper and Robitussin-AC. Patient is aware that this is viral and will need to run its course. Smoking also aggravates this and will  cause increased coughing. ____________________________________________   FINAL CLINICAL IMPRESSION(S) / ED DIAGNOSES  Final diagnoses:  Viral upper respiratory illness  Acute bronchitis, unspecified organism  Cigarette smoker      Tommi Rumps, PA-C 07/22/15 1406  Governor Rooks, MD 07/22/15 1425

## 2015-07-22 NOTE — Discharge Instructions (Signed)
Begin taking medication as directed. Follow-up with your primary care doctor or [facility] if any continued problems. Discontinue smoking. Increase fluids. Proventil inhaler, Robitussin-AC, and prednisone 6 day taper.

## 2015-07-22 NOTE — ED Notes (Signed)
Discussed discharge instructions, prescriptions, and follow-up care with patient. No questions or concerns at this time. Pt stable at discharge.  

## 2015-07-22 NOTE — ED Notes (Addendum)
Pt c/o cough for 2 weeks.  Cough is dry.  Pt reports she woke up and felt like couldn't catch breath; was coughing.  Tried inhaler but reports didn't feel like helped. No respiratory distress in triage.  No wheezing on ausculation of lungs. No fever. Has had frontal sinus pain.  Anterior nasal drainage but denies posterior.

## 2015-09-29 ENCOUNTER — Emergency Department (HOSPITAL_COMMUNITY)
Admission: EM | Admit: 2015-09-29 | Discharge: 2015-09-29 | Disposition: A | Discharge status: Home / Self Care | Payer: Self-pay | Attending: Emergency Medicine | Admitting: Emergency Medicine

## 2015-09-29 ENCOUNTER — Encounter (HOSPITAL_COMMUNITY): Payer: Self-pay | Admitting: Emergency Medicine

## 2015-09-29 DIAGNOSIS — J9801 Acute bronchospasm: Secondary | ICD-10-CM | POA: Insufficient documentation

## 2015-09-29 DIAGNOSIS — F17210 Nicotine dependence, cigarettes, uncomplicated: Secondary | ICD-10-CM | POA: Insufficient documentation

## 2015-09-29 DIAGNOSIS — F329 Major depressive disorder, single episode, unspecified: Secondary | ICD-10-CM | POA: Insufficient documentation

## 2015-09-29 MED ORDER — ALBUTEROL SULFATE (2.5 MG/3ML) 0.083% IN NEBU
2.5000 mg | INHALATION_SOLUTION | RESPIRATORY_TRACT | Status: DC | PRN
Start: 2015-09-29 — End: 2016-08-27

## 2015-09-29 MED ORDER — IPRATROPIUM-ALBUTEROL 0.5-2.5 (3) MG/3ML IN SOLN
3.0000 mL | Freq: Once | RESPIRATORY_TRACT | Status: AC
  Administered 2015-09-29: 3 mL via RESPIRATORY_TRACT
  Filled 2015-09-29: qty 3

## 2015-09-29 MED ORDER — PREDNISONE 20 MG PO TABS: ORAL_TABLET | ORAL | Status: DC

## 2015-09-29 MED ORDER — FLUTICASONE PROPIONATE HFA 110 MCG/ACT IN AERO: 1.0000 | INHALATION_SPRAY | Freq: Two times a day (BID) | RESPIRATORY_TRACT | Status: DC

## 2015-09-29 MED ORDER — PREDNISONE 20 MG PO TABS
60.0000 mg | ORAL_TABLET | Freq: Once | ORAL | Status: AC
  Administered 2015-09-29: 60 mg via ORAL
  Filled 2015-09-29: qty 3

## 2015-09-29 NOTE — ED Notes (Signed)
Provider in room  

## 2015-09-29 NOTE — ED Provider Notes (Signed)
CSN: [id number]     Arrival date & time 09/29/15  0535 History   First MD Initiated Contact with Patient 09/29/15 0549     Chief Complaint  Patient presents with  . URI     (Consider location/radiation/quality/duration/timing/severity/associated sxs/prior Treatment) Patient is a 40 y.o. female presenting with URI. The history is provided by the patient.  URI Her actual complaint is cough and difficulty breathing. For the last 4 months, she has had a nonproductive cough with episodes of dyspnea. She has a history of asthma as a child and it feels like the asthma has recurred. She had been to see physician twice and had chest x-rays which were unremarkable. She had been given an albuterol inhaler and also had been given a course of prednisone. She felt much better while on prednisone. This morning, she woke up with difficulty breathing and could not inhale enough to use the inhaler. She denies fever or chills. She denies chest pain. She is a cigarette smoker, but states that she is acting not able to smoke because she chokes when she tries to smoke. She had been living in a trailer that had some mold in it. They have tried to clean the mold, but it did not improve her coughing. Dyspnea is tense to be worse at night and tends to wake her up. She generally feels worse when she wakes up in the morning and somewhat better during the day.  Past Medical History  Diagnosis Date  . Asthma   . H/O: drug dependency (HCC)   . Narcotic addiction (HCC)   . Depression   . Migraine   . Anxiety    Past Surgical History  Procedure Laterality Date  . Ectopic pregnancy surgery    . Tooth extraction     Family History  Problem Relation Age of Onset  . Diabetes Other   . Hypertension Other   . Cancer Maternal Aunt    Social History  Substance Use Topics  . Smoking status: Current Every Day Smoker -- 1.00 packs/day for 20 years    Types: Cigarettes  . Smokeless tobacco: None  . Alcohol Use: Yes      Comment: occ   OB History    Gravida Para Term Preterm AB TAB SAB Ectopic Multiple Living   Review of Systems  All other systems reviewed and are negative.     Allergies  Penicillins and Sulfonamide derivatives  Home Medications   Prior to Admission medications   Medication Sig Start Date End Date Taking? Authorizing Provider  adalimumab (HUMIRA) 40 MG/0.8ML injection Inject 40 mg into the skin every 14 (fourteen) days.     Historical Provider, MD  albuterol (PROVENTIL HFA;VENTOLIN HFA) 108 (90 Base) MCG/ACT inhaler Inhale 2 puffs into the lungs every 6 (six) hours as needed for wheezing or shortness of breath. 07/22/15   Tommi Rumps, PA-C  Buprenorphine HCl-Naloxone HCl 4-1 MG FILM Place 1 Film under the tongue 2 (two) times daily.    Historical Provider, MD  cyclobenzaprine (FLEXERIL) 10 MG tablet Take 1 tablet (10 mg total) by mouth every 8 (eight) hours as needed for muscle spasms. 06/22/15   Governor Rooks, MD  FLUoxetine (PROZAC) 40 MG capsule Take 40 mg by mouth daily.     Historical Provider, MD  guaiFENesin-codeine 100-10 MG/5ML syrup Take 5 mLs by mouth every 4 (four) hours as needed. 07/22/15   Tommi Rumps,  PA-C  ibuprofen (ADVIL,MOTRIN) 600 MG tablet Take 1 tablet (600 mg total) by mouth every 8 (eight) hours as needed. Patient taking differently: Take 600 mg by mouth every 8 (eight) hours as needed for headache or moderate pain.  06/22/15   Governor Rooksebecca Lord, MD  predniSONE (DELTASONE) 10 MG tablet Take 6 tablets  today, on day 2 take 5 tablets, day 3 take 4 tablets, day 4 take 3 tablets, day 5 take  2 tablets and 1 tablet the last day 07/22/15   Tommi Rumpshonda L Summers, PA-C  valACYclovir (VALTREX) 500 MG tablet Take 500 mg by mouth as needed (for flare-ups).     Historical Provider, MD   BP 111/72 mmHg  Pulse 86  Temp(Src) 98.2 F (36.8 C) (Oral)  Resp 16  Ht 6' (1.829 m)  Wt 180 lb (81.647 kg)  BMI 24.41 kg/m2  SpO2 98%  LMP 09/08/2015 Physical Exam   Nursing note and vitals reviewed.  40 year old female, resting comfortably and in no acute distress. Vital signs are normal. Oxygen saturation is 98%, which is normal. Head is normocephalic and atraumatic. PERRLA, EOMI. Oropharynx is clear. Neck is nontender and supple without adenopathy or JVD. Back is nontender and there is no CVA tenderness. Lungs are clear without rales, wheezes, or rhonchi. However, with forced exhalation, there is prolonged exhalation phase with intermittent wheezing. Cough was also triggered by forced exhalation and cough was noted to be wheezy in character. Chest is nontender. Heart has regular rate and rhythm without murmur. Abdomen is soft, flat, nontender without masses or hepatosplenomegaly and peristalsis is normoactive. Extremities have no cyanosis or edema, full range of motion is present. Skin is warm and dry without rash. Neurologic: Mental status is normal, cranial nerves are intact, there are no motor or sensory deficits.  ED Course  Procedures (including critical care time)   MDM   Final diagnoses:  Cough due to bronchospasm    Subacute cough which appears to be bronchospastic. She had improvement with oral steroids. Given this improvement, I feel she would best be managed by using a steroid inhaler. She is encouraged to stop smoking. She is given a dose of prednisone in the ED and nebulizer treatment with albuterol and ipratropium. Old records were reviewed confirming 2 prior ED visits for cough. A friend gave her a nebulizer to use at home when she is unable to use her inhaler. She is given a prescription for albuterol for her nebulizer and is given a prescription for prednisone-40 mg a day for 1 week, then 20 mg a day for 1 week. She is given a prescription for fluticasone inhaler. She is referred to pulmonology for follow-up.    Dione Boozeavid Amylah Will, MD 09/29/15 [phone]

## 2015-09-29 NOTE — ED Notes (Signed)
Pt c/o upper respiratory infection since February of this year. Pt states SHOB worse at night. Pt called EMS today for same but was not transported.

## 2015-09-29 NOTE — Discharge Instructions (Signed)
Bronchospasm, Adult  A bronchospasm is a spasm or tightening of the airways going into the lungs. During a bronchospasm breathing becomes more difficult because the airways get smaller. When this happens there can be coughing, a whistling sound when breathing (wheezing), and difficulty breathing. Bronchospasm is often associated with asthma, but not all patients who experience a bronchospasm have asthma.  CAUSES   A bronchospasm is caused by inflammation or irritation of the airways. The inflammation or irritation may be triggered by:   · Allergies (such as to animals, pollen, food, or mold). Allergens that cause bronchospasm may cause wheezing immediately after exposure or many hours later.    · Infection. Viral infections are believed to be the most common cause of bronchospasm.    · Exercise.    · Irritants (such as pollution, cigarette smoke, strong odors, aerosol sprays, and paint fumes).    · Weather changes. Winds increase molds and pollens in the air. Rain refreshes the air by washing irritants out. Cold air may cause inflammation.    · Stress and emotional upset.    SIGNS AND SYMPTOMS   · Wheezing.    · Excessive nighttime coughing.    · Frequent or severe coughing with a simple cold.    · Chest tightness.    · Shortness of breath.    DIAGNOSIS   Bronchospasm is usually diagnosed through a history and physical exam. Tests, such as chest X-rays, are sometimes done to look for other conditions.  TREATMENT   · Inhaled medicines can be given to open up your airways and help you breathe. The medicines can be given using either an inhaler or a nebulizer machine.  · Corticosteroid medicines may be given for severe bronchospasm, usually when it is associated with asthma.  HOME CARE INSTRUCTIONS   · Always have a plan prepared for seeking medical care. Know when to call your health care provider and local emergency services (911 in the [place] Know where you can access local emergency care.  · Only take medicines as  directed by your health care provider.  · If you were prescribed an inhaler or nebulizer machine, ask your health care provider to explain how to use it correctly. Always use a spacer with your inhaler if you were given one.  · It is necessary to remain calm during an attack. Try to relax and breathe more slowly.   · Control your home environment in the following ways:      Change your heating and air conditioning filter at least once a month.      Limit your use of fireplaces and wood stoves.    Do not smoke and do not allow smoking in your home.      Avoid exposure to perfumes and fragrances.      Get rid of pests (such as roaches and mice) and their droppings.      Throw away plants if you see mold on them.      Keep your house clean and dust free.      Replace carpet with wood, tile, or vinyl flooring. Carpet can trap dander and dust.      Use allergy-proof pillows, mattress covers, and box spring covers.      Wash bed sheets and blankets every week in hot water and dry them in a dryer.      Use blankets that are made of polyester or cotton.      Wash hands frequently.  SEEK MEDICAL CARE IF:   · You have muscle aches.    · You have chest pain.    · The sputum changes from clear or   even after taking your prescribed medicines.   You have increased difficulty breathing.   You develop severe chest pain. MAKE SURE YOU:   Understand these instructions.  Will watch your condition.  Will get help right away if you are not doing well or get worse.   This information is not intended to replace advice given to you by your health care provider. Make sure you discuss any questions you have with your health care  provider.   Document Released: 04/10/2003 Document Revised: 04/28/2014 Document Reviewed: 09/27/2012 Elsevier Interactive Patient Education 2016 [employer].  Smoking Cessation, Tips for Success If you are ready to quit smoking, congratulations! You have chosen to help yourself be healthier. Cigarettes bring nicotine, tar, carbon monoxide, and other irritants into your body. Your lungs, heart, and blood vessels will be able to work better without these poisons. There are many different ways to quit smoking. Nicotine gum, nicotine patches, a nicotine inhaler, or nicotine nasal spray can help with physical craving. Hypnosis, support groups, and medicines help break the habit of smoking. WHAT THINGS CAN I DO TO MAKE QUITTING EASIER?  Here are some tips to help you quit for good:  Pick a date when you will quit smoking completely. Tell all of your friends and family about your plan to quit on that date.  Do not try to slowly cut down on the number of cigarettes you are smoking. Pick a quit date and quit smoking completely starting on that day.  Throw away all cigarettes.   Clean and remove all ashtrays from your home, work, and car.  On a card, write down your reasons for quitting. Carry the card with you and read it when you get the urge to smoke.  Cleanse your body of nicotine. Drink enough water and fluids to keep your urine clear or pale yellow. Do this after quitting to flush the nicotine from your body.  Learn to predict your moods. Do not let a bad situation be your excuse to have a cigarette. Some situations in your life might tempt you into wanting a cigarette.  Never have "just one" cigarette. It leads to wanting another and another. Remind yourself of your decision to quit.  Change habits associated with smoking. If you smoked while driving or when feeling stressed, try other activities to replace smoking. Stand up when drinking your coffee. Brush your teeth after eating. Sit in a  different chair when you read the paper. Avoid alcohol while trying to quit, and try to drink fewer caffeinated beverages. Alcohol and caffeine may urge you to smoke.  Avoid foods and drinks that can trigger a desire to smoke, such as sugary or spicy foods and alcohol.  Ask people who smoke not to smoke around you.  Have something planned to do right after eating or having a cup of coffee. For example, plan to take a walk or exercise.  Try a relaxation exercise to calm you down and decrease your stress. Remember, you may be tense and nervous for the first 2 weeks after you quit, but this will pass.  Find new activities to keep your hands busy. Play with a pen, coin, or rubber band. Doodle or draw things on paper.  Brush your teeth right after eating. This will help cut down on the craving for the taste of tobacco after meals. You can also try mouthwash.   Use oral substitutes in place of cigarettes. Try using lemon drops, carrots, cinnamon sticks, or chewing gum. Keep them  handy so they are available when you have the urge to smoke.  When you have the urge to smoke, try deep breathing.  Designate your home as a nonsmoking area.  If you are a heavy smoker, ask your health care provider about a prescription for nicotine chewing gum. It can ease your withdrawal from nicotine.  Reward yourself. Set aside the cigarette money you save and buy yourself something nice.  Look for support from others. Join a support group or smoking cessation program. Ask someone at home or at work to help you with your plan to quit smoking.  Always ask yourself, "Do I need this cigarette or is this just a reflex?" Tell yourself, "Today, I choose not to smoke," or "I do not want to smoke." You are reminding yourself of your decision to quit.  Do not replace cigarette smoking with electronic cigarettes (commonly called e-cigarettes). The safety of e-cigarettes is unknown, and some may contain harmful  chemicals.  If you relapse, do not give up! Plan ahead and think about what you will do the next time you get the urge to smoke. HOW WILL I FEEL WHEN I QUIT SMOKING? You may have symptoms of withdrawal because your body is used to nicotine (the addictive substance in cigarettes). You may crave cigarettes, be irritable, feel very hungry, cough often, get headaches, or have difficulty concentrating. The withdrawal symptoms are only temporary. They are strongest when you first quit but will go away within 10-14 days. When withdrawal symptoms occur, stay in control. Think about your reasons for quitting. Remind yourself that these are signs that your body is healing and getting used to being without cigarettes. Remember that withdrawal symptoms are easier to treat than the major diseases that smoking can cause.  Even after the withdrawal is over, expect periodic urges to smoke. However, these cravings are generally short lived and will go away whether you smoke or not. Do not smoke! WHAT RESOURCES ARE AVAILABLE TO HELP ME QUIT SMOKING? Your health care provider can direct you to community resources or hospitals for support, which may include:  Group support.  Education.  Hypnosis.  Therapy.   This information is not intended to replace advice given to you by your health care provider. Make sure you discuss any questions you have with your health care provider.   Document Released: 01/04/2004 Document Revised: 04/28/2014 Document Reviewed: 09/23/2012 Elsevier Interactive Patient Education 2016 [employer] Inc[employer]  Prednisone tablets What is this medicine? PREDNISONE (PRED ni sone) is a corticosteroid. It is commonly used to treat inflammation of the skin, joints, lungs, and other organs. Common conditions treated include asthma, allergies, and arthritis. It is also used for other conditions, such as blood disorders and diseases of the adrenal glands. This medicine may be used for other purposes; ask  your health care provider or pharmacist if you have questions. What should I tell my health care provider before I take this medicine? They need to know if you have any of these conditions: -Cushing's syndrome -diabetes -glaucoma -heart disease -high blood pressure -infection (especially a virus infection such as chickenpox, cold sores, or herpes) -kidney disease -liver disease -mental illness -myasthenia gravis -osteoporosis -seizures -stomach or intestine problems -thyroid disease -an unusual or allergic reaction to lactose, prednisone, other medicines, foods, dyes, or preservatives -pregnant or trying to get pregnant -breast-feeding How should I use this medicine? Take this medicine by mouth with a glass of water. Follow the directions on the prescription label. Take this medicine with  food. If you are taking this medicine once a day, take it in the morning. Do not take more medicine than you are told to take. Do not suddenly stop taking your medicine because you may develop a severe reaction. Your doctor will tell you how much medicine to take. If your doctor wants you to stop the medicine, the dose may be slowly lowered over time to avoid any side effects. Talk to your pediatrician regarding the use of this medicine in children. Special care may be needed. Overdosage: If you think you have taken too much of this medicine contact a poison control center or emergency room at once. NOTE: This medicine is only for you. Do not share this medicine with others. What if I miss a dose? If you miss a dose, take it as soon as you can. If it is almost time for your next dose, talk to your doctor or health care professional. You may need to miss a dose or take an extra dose. Do not take double or extra doses without advice. What may interact with this medicine? Do not take this medicine with any of the following medications: -metyrapone -mifepristone This medicine may also interact with the  following medications: -aminoglutethimide -amphotericin B -aspirin and aspirin-like medicines -barbiturates -certain medicines for diabetes, like glipizide or glyburide -cholestyramine -cholinesterase inhibitors -cyclosporine -digoxin -diuretics -ephedrine -female hormones, like estrogens and birth control pills -isoniazid -ketoconazole -NSAIDS, medicines for pain and inflammation, like ibuprofen or naproxen -phenytoin -rifampin -toxoids -vaccines -warfarin This list may not describe all possible interactions. Give your health care provider a list of all the medicines, herbs, non-prescription drugs, or dietary supplements you use. Also tell them if you smoke, drink alcohol, or use illegal drugs. Some items may interact with your medicine. What should I watch for while using this medicine? Visit your doctor or health care professional for regular checks on your progress. If you are taking this medicine over a prolonged period, carry an identification card with your name and address, the type and dose of your medicine, and your doctor's name and address. This medicine may increase your risk of getting an infection. Tell your doctor or health care professional if you are around anyone with measles or chickenpox, or if you develop sores or blisters that do not heal properly. If you are going to have surgery, tell your doctor or health care professional that you have taken this medicine within the last twelve months. Ask your doctor or health care professional about your diet. You may need to lower the amount of salt you eat. This medicine may affect blood sugar levels. If you have diabetes, check with your doctor or health care professional before you change your diet or the dose of your diabetic medicine. What side effects may I notice from receiving this medicine? Side effects that you should report to your doctor or health care professional as soon as possible: -allergic reactions like  skin rash, itching or hives, swelling of the face, lips, or tongue -changes in emotions or moods -changes in vision -depressed mood -eye pain -fever or chills, cough, sore throat, pain or difficulty passing urine -increased thirst -swelling of ankles, feet Side effects that usually do not require medical attention (report to your doctor or health care professional if they continue or are bothersome): -confusion, excitement, restlessness -headache -nausea, vomiting -skin problems, acne, thin and shiny skin -trouble sleeping -weight gain This list may not describe all possible side effects. Call your doctor for medical advice  about side effects. You may report side effects to FDA at 1-800-FDA-1088. Where should I keep my medicine? Keep out of the reach of children. Store at room temperature between 15 and 30 degrees C (59 and 86 degrees F). Protect from light. Keep container tightly closed. Throw away any unused medicine after the expiration date. NOTE: This sheet is a summary. It may not cover all possible information. If you have questions about this medicine, talk to your doctor, pharmacist, or health care provider.    2016, Elsevier/Gold Standard. (2010-11-21 10:57:14)  Fluticasone inhalation aerosol What is this medicine? FLUTICASONE (floo TIK a sone) inhalation is a corticosteroid. It helps decrease inflammation in your lungs. This medicine is used to treat the symptoms of asthma. Never use this medicine for an acute asthma attack. This medicine may be used for other purposes; ask your health care provider or pharmacist if you have questions. What should I tell my health care provider before I take this medicine? They need to know if you have any of these conditions: -bone problems -immune system problems -infection, like chickenpox, tuberculosis, herpes, or fungal infection -recent surgery or injury of mouth or throat -taking corticosteroids by mouth -an unusual or allergic  reaction to fluticasone, steroids, other medicines, foods, dyes, or preservatives -pregnant or trying to get pregnant -breast-feeding How should I use this medicine? This medicine is for inhalation through the mouth. Rinse your mouth with water after use. Make sure not to swallow the water. Follow the directions on your prescription label. Do not use more often than directed. Do not stop taking your medicine unless your doctor tells you to. Make sure that you are using your inhaler correctly. Ask you doctor or health care provider if you have any questions. Talk to your pediatrician regarding the use of this medicine in children. Special care may be needed. While this drug may be prescribed for children as young as 33 years of age for selected conditions, precautions do apply. Overdosage: If you think you have taken too much of this medicine contact a poison control center or emergency room at once. NOTE: This medicine is only for you. Do not share this medicine with others. What if I miss a dose? If you miss a dose, use it as soon as you remember. If it is almost time for your next dose, use only that dose and continue with your regular schedule, spacing doses evenly. Do not use double or extra doses. What may interact with this medicine? -ketoconazole -ritonavir This list may not describe all possible interactions. Give your health care provider a list of all the medicines, herbs, non-prescription drugs, or dietary supplements you use. Also tell them if you smoke, drink alcohol, or use illegal drugs. Some items may interact with your medicine. What should I watch for while using this medicine? Visit your doctor or health care professional for regular checks on your progress. Check with your health care professional if your symptoms do not improve. If your symptoms get worse or if you need your short-acting inhalers more often, call your doctor right away. Try not to come in contact with people who  have chickenpox or the measles while you are taking this medicine. If you do, call your doctor right away. What side effects may I notice from receiving this medicine? Side effects that you should report to your doctor or health care professional as soon as possible: -allergic reactions like skin rash, itching or hives -changes in vision -chest pain -flu-like symptoms -  trouble breathing or wheezing -unusual swelling -white patches or sores in the mouth or throat Side effects that usually do not require medical attention (report to your doctor or health care professional if they continue or are bothersome): -coughing, hoarseness or throat irritation -dry mouth -flushing -headache -loss of taste, or unpleasant taste This list may not describe all possible side effects. Call your doctor for medical advice about side effects. You may report side effects to FDA at 1-800-FDA-1088. Where should I keep my medicine? Keep out of the reach of children. Store at room temperature between 15 and 30 degrees C (59 and 86 degrees F) with the mouthpiece down. Do not puncture the canister. Do not store it or use it near heat or an open flame. Exposure to temperatures above 120 degrees F may cause it to burst. Never throw it into a fire or incinerator. Throw away after the expiration date. NOTE: This sheet is a summary. It may not cover all possible information. If you have questions about this medicine, talk to your doctor, pharmacist, or health care provider.    2016, Elsevier/Gold Standard. (2012-09-23 11:34:29)  Albuterol inhalation solution What is this medicine? ALBUTEROL (al Gaspar Bidding) is a bronchodilator. It helps to open up the airways in your lungs to make it easier to breathe. This medicine is used to treat and to prevent bronchospasm. This medicine may be used for other purposes; ask your health care provider or pharmacist if you have questions. What should I tell my health care provider  before I take this medicine? They need to know if you have any of the following conditions: -diabetes -heart disease or irregular heartbeat -high blood pressure -pheochromocytoma -seizures -thyroid disease -an unusual or allergic reaction to albuterol, levalbuterol, sulfites, other medicines, foods, dyes, or preservatives -pregnant or trying to get pregnant -breast-feeding How should I use this medicine? This medicine is used in a nebulizer. Nebulizers make a liquid into an aerosol that you breathe in through your mouth or your mouth and nose into your lungs. You will be taught how to use your nebulizer. Follow the directions on your prescription label. Take your medicine at regular intervals. Do not use more often than directed. Talk to your pediatrician regarding the use of this medicine in children. Special care may be needed. Overdosage: If you think you have taken too much of this medicine contact a poison control center or emergency room at once. NOTE: This medicine is only for you. Do not share this medicine with others. What if I miss a dose? If you miss a dose, use it as soon as you can. If it is almost time for your next dose, use only that dose. Do not use double or extra doses. What may interact with this medicine? -anti-infectives like chloroquine and pentamidine -caffeine -cisapride -diuretics -medicines for colds -medicines for depression or emotional or psychotic conditions -medicines for weight loss including some herbal products -methadone -some antibiotics like clarithromycin, erythromycin, levofloxacin, and linezolid -some heart medicines -steroid hormones like dexamethasone, cortisone, hydrocortisone -theophylline -thyroid hormones This list may not describe all possible interactions. Give your health care provider a list of all the medicines, herbs, non-prescription drugs, or dietary supplements you use. Also tell them if you smoke, drink alcohol, or use illegal  drugs. Some items may interact with your medicine. What should I watch for while using this medicine? Tell your doctor or health care professional if your symptoms do not improve. Do not use extra albuterol. Call your  doctor right away if your asthma or bronchitis gets worse while you are using this medicine. If your mouth gets dry try chewing sugarless gum or sucking hard candy. Drink water as directed. What side effects may I notice from receiving this medicine? Side effects that you should report to your doctor or health care professional as soon as possible: -allergic reactions like skin rash, itching or hives, swelling of the face, lips, or tongue -breathing problems -chest pain -feeling faint or lightheaded, falls -high blood pressure -irregular heartbeat -fever -muscle cramps or weakness -pain, tingling, numbness in the hands or feet -vomiting Side effects that usually do not require medical attention (report to your doctor or health care professional if they continue or are bothersome): -cough -difficulty sleeping -headache -nervousness, trembling -stomach upset -stuffy or runny nose -throat irritation -unusual taste This list may not describe all possible side effects. Call your doctor for medical advice about side effects. You may report side effects to FDA at 1-800-FDA-1088. Where should I keep my medicine? Keep out of the reach of children. Store between 2 and 25 degrees C (36 and 77 degrees F). Do not freeze. Protect from light. Throw away any unused medicine after the expiration date. Most products are kept in the foil package until time of use. Some products can be used up to 1 week after they are removed from the foil pouch. Check the instructions that come with your medicine. NOTE: This sheet is a summary. It may not cover all possible information. If you have questions about this medicine, talk to your doctor, pharmacist, or health care provider.    2016,  Elsevier/[employer] Standard. (2010-12-27 15:19:55)

## 2015-10-28 ENCOUNTER — Encounter (HOSPITAL_COMMUNITY): Payer: Self-pay | Admitting: *Deleted

## 2015-10-28 ENCOUNTER — Emergency Department (HOSPITAL_COMMUNITY)
Admission: EM | Admit: 2015-10-28 | Discharge: 2015-10-28 | Disposition: A | Discharge status: Home / Self Care | Payer: BLUE CROSS/BLUE SHIELD | Attending: Dermatology | Admitting: Dermatology

## 2015-10-28 ENCOUNTER — Ambulatory Visit (HOSPITAL_COMMUNITY)
Admission: EM | Admit: 2015-10-28 | Discharge: 2015-10-28 | Disposition: A | Discharge status: Home / Self Care | Payer: BLUE CROSS/BLUE SHIELD

## 2015-10-28 DIAGNOSIS — F329 Major depressive disorder, single episode, unspecified: Secondary | ICD-10-CM | POA: Diagnosis not present

## 2015-10-28 DIAGNOSIS — R0602 Shortness of breath: Secondary | ICD-10-CM | POA: Diagnosis present

## 2015-10-28 DIAGNOSIS — Z5321 Procedure and treatment not carried out due to patient leaving prior to being seen by health care provider: Secondary | ICD-10-CM | POA: Insufficient documentation

## 2015-10-28 DIAGNOSIS — R0600 Dyspnea, unspecified: Secondary | ICD-10-CM | POA: Diagnosis present

## 2015-10-28 DIAGNOSIS — F17210 Nicotine dependence, cigarettes, uncomplicated: Secondary | ICD-10-CM | POA: Insufficient documentation

## 2015-10-28 DIAGNOSIS — J45909 Unspecified asthma, uncomplicated: Secondary | ICD-10-CM | POA: Insufficient documentation

## 2015-10-28 LAB — BASIC METABOLIC PANEL
ANION GAP: 9 (ref 5–15)
BUN: 7 mg/dL (ref 6–20)
CALCIUM: 9 mg/dL (ref 8.9–10.3)
CO2: 22 mmol/L (ref 22–32)
CREATININE: 0.68 mg/dL (ref 0.44–1.00)
Chloride: 105 mmol/L (ref 101–111)
GLUCOSE: 104 mg/dL — AB (ref 65–99)
Potassium: 3.7 mmol/L (ref 3.5–5.1)
Sodium: 136 mmol/L (ref 135–145)

## 2015-10-28 LAB — URINALYSIS, ROUTINE W REFLEX MICROSCOPIC
BILIRUBIN URINE: NEGATIVE
GLUCOSE, UA: NEGATIVE mg/dL
Hgb urine dipstick: NEGATIVE
KETONES UR: 15 mg/dL — AB
LEUKOCYTES UA: NEGATIVE
Nitrite: NEGATIVE
PH: 5.5 (ref 5.0–8.0)
PROTEIN: NEGATIVE mg/dL
Specific Gravity, Urine: 1.015 (ref 1.005–1.030)

## 2015-10-28 LAB — CBC WITH DIFFERENTIAL/PLATELET
BASOS ABS: 0 10*3/uL (ref 0.0–0.1)
BASOS PCT: 0 %
EOS PCT: 7 %
Eosinophils Absolute: 0.6 10*3/uL (ref 0.0–0.7)
HCT: 41 % (ref 36.0–46.0)
Hemoglobin: 13.4 g/dL (ref 12.0–15.0)
Lymphocytes Relative: 31 %
Lymphs Abs: 3 10*3/uL (ref 0.7–4.0)
MCH: 30.9 pg (ref 26.0–34.0)
MCHC: 32.7 g/dL (ref 30.0–36.0)
MCV: 94.7 fL (ref 78.0–100.0)
MONO ABS: 0.6 10*3/uL (ref 0.1–1.0)
MONOS PCT: 6 %
Neutro Abs: 5.5 10*3/uL (ref 1.7–7.7)
Neutrophils Relative %: 56 %
PLATELETS: 233 10*3/uL (ref 150–400)
RBC: 4.33 MIL/uL (ref 3.87–5.11)
RDW: 14 % (ref 11.5–15.5)
WBC: 9.8 10*3/uL (ref 4.0–10.5)

## 2015-10-28 NOTE — ED Notes (Signed)
Pt eloped before provider could see pt.

## 2015-10-28 NOTE — ED Notes (Signed)
Pt refuses to get undressed and states she is just going to leave bc she doesn't have time to wait.

## 2015-10-28 NOTE — ED Notes (Signed)
Patient presents stating for several months she has been having difficulty breathing.. Is out of her inhaler and does have a script for steriods but states she cannot afford them.  NAD noted in truage

## 2015-10-28 NOTE — ED Notes (Signed)
Pt is in NAD, and is drinking a soda. Pt is upset about getting into a gown and is moaning about taking off her clothes.

## 2015-10-29 ENCOUNTER — Emergency Department
Admission: EM | Admit: 2015-10-29 | Discharge: 2015-10-29 | Disposition: A | Discharge status: Home / Self Care | Payer: BLUE CROSS/BLUE SHIELD | Attending: Emergency Medicine | Admitting: Emergency Medicine

## 2015-10-29 ENCOUNTER — Emergency Department: Payer: BLUE CROSS/BLUE SHIELD

## 2015-10-29 ENCOUNTER — Encounter: Payer: Self-pay | Admitting: Emergency Medicine

## 2015-10-29 DIAGNOSIS — R062 Wheezing: Secondary | ICD-10-CM

## 2015-10-29 DIAGNOSIS — R0602 Shortness of breath: Secondary | ICD-10-CM

## 2015-10-29 DIAGNOSIS — J40 Bronchitis, not specified as acute or chronic: Secondary | ICD-10-CM

## 2015-10-29 MED ORDER — IPRATROPIUM-ALBUTEROL 0.5-2.5 (3) MG/3ML IN SOLN
3.0000 mL | Freq: Once | RESPIRATORY_TRACT | Status: AC
  Administered 2015-10-29: 3 mL via RESPIRATORY_TRACT
  Filled 2015-10-29: qty 3

## 2015-10-29 MED ORDER — BENZONATATE 100 MG PO CAPS: 100.0000 mg | ORAL_CAPSULE | Freq: Four times a day (QID) | ORAL | Status: DC | PRN

## 2015-10-29 MED ORDER — PREDNISONE 20 MG PO TABS
60.0000 mg | ORAL_TABLET | Freq: Once | ORAL | Status: AC
  Administered 2015-10-29: 60 mg via ORAL
  Filled 2015-10-29: qty 3

## 2015-10-29 MED ORDER — PREDNISONE 20 MG PO TABS: 60.0000 mg | ORAL_TABLET | Freq: Every day | ORAL | Status: DC

## 2015-10-29 MED ORDER — BENZONATATE 100 MG PO CAPS
100.0000 mg | ORAL_CAPSULE | Freq: Once | ORAL | Status: AC
  Administered 2015-10-29: 100 mg via ORAL
  Filled 2015-10-29: qty 1

## 2015-10-29 MED ORDER — ALBUTEROL SULFATE HFA 108 (90 BASE) MCG/ACT IN AERS: 2.0000 | INHALATION_SPRAY | Freq: Four times a day (QID) | RESPIRATORY_TRACT | Status: DC | PRN

## 2015-10-29 NOTE — ED Notes (Signed)
Pt was at [facility] earlier tonight; left without seeing a provider because it was taking too long; had blood drawn and EKG performed but no chest xray; pt would not like more blood drawn in triage, would like to wait until she sees the MD to discuss; EKG repeated here; will order chest xray; pt says feeling bad intermittently since March; seems to do better when she is on an inhaler and a steroid; last round of steroids she had was about 3 weeks ago; has not filled one inhaler that was prescribed some time ago due to money; pt denies fever; denies cough

## 2015-10-29 NOTE — ED Notes (Signed)
Pt cursing at this RN as this RN goes over D/C papers

## 2015-10-29 NOTE — Discharge Instructions (Signed)
Upper Respiratory Infection, Adult °Most upper respiratory infections (URIs) are a viral infection of the air passages leading to the lungs. A URI affects the nose, throat, and upper air passages. The most common type of URI is nasopharyngitis and is typically referred to as "the common cold." °URIs run their course and usually go away on their own. Most of the time, a URI does not require medical attention, but sometimes a bacterial infection in the upper airways can follow a viral infection. This is called a secondary infection. Sinus and middle ear infections are common types of secondary upper respiratory infections. °Bacterial pneumonia can also complicate a URI. A URI can worsen asthma and chronic obstructive pulmonary disease (COPD). Sometimes, these complications can require emergency medical care and may be life threatening.  °CAUSES °Almost all URIs are caused by viruses. A virus is a type of germ and can spread from one person to another.  °RISKS FACTORS °You may be at risk for a URI if:  °· You smoke.   °· You have chronic heart or lung disease. °· You have a weakened defense (immune) system.   °· You are very young or very old.   °· You have nasal allergies or asthma. °· You work in crowded or poorly ventilated areas. °· You work in health care facilities or schools. °SIGNS AND SYMPTOMS  °Symptoms typically develop 2-3 days after you come in contact with a cold virus. Most viral URIs last 7-10 days. However, viral URIs from the influenza virus (flu virus) can last 14-18 days and are typically more severe. Symptoms may include:  °· Runny or stuffy (congested) nose.   °· Sneezing.   °· Cough.   °· Sore throat.   °· Headache.   °· Fatigue.   °· Fever.   °· Loss of appetite.   °· Pain in your forehead, behind your eyes, and over your cheekbones (sinus pain). °· Muscle aches.   °DIAGNOSIS  °Your health care provider may diagnose a URI by: °· Physical exam. °· Tests to check that your symptoms are not due to  another condition such as: °· Strep throat. °· Sinusitis. °· Pneumonia. °· Asthma. °TREATMENT  °A URI goes away on its own with time. It cannot be cured with medicines, but medicines may be prescribed or recommended to relieve symptoms. Medicines may help: °· Reduce your fever. °· Reduce your cough. °· Relieve nasal congestion. °HOME CARE INSTRUCTIONS  °· Take medicines only as directed by your health care provider.   °· Gargle warm saltwater or take cough drops to comfort your throat as directed by your health care provider. °· Use a warm mist humidifier or inhale steam from a shower to increase air moisture. This may make it easier to breathe. °· Drink enough fluid to keep your urine clear or pale yellow.   °· Eat soups and other clear broths and maintain good nutrition.   °· Rest as needed.   °· Return to work when your temperature has returned to normal or as your health care provider advises. You may need to stay home longer to avoid infecting others. You can also use a face mask and careful hand washing to prevent spread of the virus. °· Increase the usage of your inhaler if you have asthma.   °· Do not use any tobacco products, including cigarettes, chewing tobacco, or electronic cigarettes. If you need help quitting, ask your health care provider. °PREVENTION  °The best way to protect yourself from getting a cold is to practice good hygiene.  °· Avoid oral or hand contact with people with cold   symptoms.   Wash your hands often if contact occurs.  There is no clear evidence that vitamin C, vitamin E, echinacea, or exercise reduces the chance of developing a cold. However, it is always recommended to get plenty of rest, exercise, and practice good nutrition.  SEEK MEDICAL CARE IF:   You are getting worse rather than better.   Your symptoms are not controlled by medicine.   You have chills.  You have worsening shortness of breath.  You have brown or red mucus.  You have yellow or brown nasal  discharge.  You have pain in your face, especially when you bend forward.  You have a fever.  You have swollen neck glands.  You have pain while swallowing.  You have white areas in the back of your throat. SEEK IMMEDIATE MEDICAL CARE IF:   You have severe or persistent:  Headache.  Ear pain.  Sinus pain.  Chest pain.  You have chronic lung disease and any of the following:  Wheezing.  Prolonged cough.  Coughing up blood.  A change in your usual mucus.  You have a stiff neck.  You have changes in your:  Vision.  Hearing.  Thinking.  Mood. MAKE SURE YOU:   Understand these instructions.  Will watch your condition.  Will get help right away if you are not doing well or get worse.   This information is not intended to replace advice given to you by your health care provider. Make sure you discuss any questions you have with your health care provider.   Document Released: 10/01/2000 Document Revised: 08/22/2014 Document Reviewed: 07/13/2013 Elsevier Interactive Patient Education 2016 Elsevier Inc.  Cough, Adult Coughing is a reflex that clears your throat and your airways. Coughing helps to heal and protect your lungs. It is normal to cough occasionally, but a cough that happens with other symptoms or lasts a long time may be a sign of a condition that needs treatment. A cough may last only 2-3 weeks (acute), or it may last longer than 8 weeks (chronic). CAUSES Coughing is commonly caused by:  Breathing in substances that irritate your lungs.  A viral or bacterial respiratory infection.  Allergies.  Asthma.  Postnasal drip.  Smoking.  Acid backing up from the stomach into the esophagus (gastroesophageal reflux).  Certain medicines.  Chronic lung problems, including COPD (or rarely, lung cancer).  Other medical conditions such as heart failure. HOME CARE INSTRUCTIONS  Pay attention to any changes in your symptoms. Take these actions to  help with your discomfort:  Take medicines only as told by your health care provider.  If you were prescribed an antibiotic medicine, take it as told by your health care provider. Do not stop taking the antibiotic even if you start to feel better.  Talk with your health care provider before you take a cough suppressant medicine.  Drink enough fluid to keep your urine clear or pale yellow.  If the air is dry, use a cold steam vaporizer or humidifier in your bedroom or your home to help loosen secretions.  Avoid anything that causes you to cough at work or at home.  If your cough is worse at night, try sleeping in a semi-upright position.  Avoid cigarette smoke. If you smoke, quit smoking. If you need help quitting, ask your health care provider.  Avoid caffeine.  Avoid alcohol.  Rest as needed. SEEK MEDICAL CARE IF:   You have new symptoms.  You cough up pus.  Your cough  does not get better after 2-3 weeks, or your cough gets worse.  You cannot control your cough with suppressant medicines and you are losing sleep.  You develop pain that is getting worse or pain that is not controlled with pain medicines.  You have a fever.  You have unexplained weight loss.  You have night sweats. SEEK IMMEDIATE MEDICAL CARE IF:  You cough up blood.  You have difficulty breathing.  Your heartbeat is very fast.   This information is not intended to replace advice given to you by your health care provider. Make sure you discuss any questions you have with your health care provider.   Document Released: 10/04/2010 Document Revised: 12/27/2014 Document Reviewed: 06/14/2014 Elsevier Interactive Patient Education 2016 [employer]  Shortness of Breath Shortness of breath means you have trouble breathing. Shortness of breath needs medical care right away. HOME CARE   Do not smoke.  Avoid being around chemicals or things (paint fumes, dust) that may bother your breathing.  Rest  as needed. Slowly begin your normal activities.  Only take medicines as told by your doctor.  Keep all doctor visits as told. GET HELP RIGHT AWAY IF:   Your shortness of breath gets worse.  You feel lightheaded, pass out (faint), or have a cough that is not helped by medicine.  You cough up blood.  You have pain with breathing.  You have pain in your chest, arms, shoulders, or belly (abdomen).  You have a fever.  You cannot walk up stairs or exercise the way you normally do.  You do not get better in the time expected.  You have a hard time doing normal activities even with rest.  You have problems with your medicines.  You have any new symptoms. MAKE SURE YOU:  Understand these instructions.  Will watch your condition.  Will get help right away if you are not doing well or get worse.   This information is not intended to replace advice given to you by your health care provider. Make sure you discuss any questions you have with your health care provider.   Document Released: 09/24/2007 Document Revised: 04/12/2013 Document Reviewed: 06/23/2011 Elsevier Interactive Patient Education [employer] Elsevier Inc.

## 2015-10-29 NOTE — ED Provider Notes (Signed)
[facility] Emergency Department Provider Note   ____________________________________________  Time seen: Approximately 0004 AM  I have reviewed the triage vital signs and the nursing notes.   HISTORY  Chief Complaint Shortness of Breath    HPI Alexandra Holt is a 40 y.o. female who comes into the hospital today with shortness of breath. The patient reports that she's been having difficulty breathing since March. She reports that it happened here and there. The patient was seen in the hospital 3 weeks ago and was given prednisone and Flovent but she did not fill the Flovent. She reports that she took that 3 weeks ago but her breathing has gotten worse again. The patient reports that she has a cough that is nonproductive and it comes with the breathing difficulty. The patient has not coughed anything up. She smokes and she works in [employer]. She reports that she does not think that this is due to a cold. She reports that she's told her bronchial tubes close up and she does not know why. She reports that she needs to see a pulmonologist. The patient was at [facility] earlier this evening but she left and went to work. She reports that she became more short of breath again so she decided to come here for evaluation.Patient is not having any chest pain any headache and any blurry vision or any other complaints at this time.   Past Medical History  Diagnosis Date  . Asthma   . H/O: drug dependency (HCC)   . Narcotic addiction (HCC)   . Depression   . Migraine   . Anxiety     Patient Active Problem List   Diagnosis Date Noted  . Weight gain 06/15/2013  . Bronchitis 06/12/2013  . GASTROENTERITIS 07/13/2009  . 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 CELLULITIS AND ABSCESS OF FACE 01/06/2008  . ARTHRITIS 01/06/2008  . NECK PAIN 06/09/2007  . DEPRESSION 01/01/2007  . DIZZINESS OR VERTIGO 01/01/2007  . HEADACHE 01/01/2007    Past Surgical  History  Procedure Laterality Date  . Ectopic pregnancy surgery    . Tooth extraction      Current Outpatient Rx  Name  Route  Sig  Dispense  Refill  . adalimumab (HUMIRA) 40 MG/0.8ML injection   Subcutaneous   Inject 40 mg into the skin every 14 (fourteen) days.          Marland Kitchen. albuterol (PROVENTIL HFA;VENTOLIN HFA) 108 (90 Base) MCG/ACT inhaler   Inhalation   Inhale 2 puffs into the lungs every 6 (six) hours as needed for wheezing or shortness of breath.   1 Inhaler   2   . albuterol (PROVENTIL HFA;VENTOLIN HFA) 108 (90 Base) MCG/ACT inhaler   Inhalation   Inhale 2 puffs into the lungs every 6 (six) hours as needed.   1 Inhaler   0   . albuterol (PROVENTIL) (2.5 MG/3ML) 0.083% nebulizer solution   Nebulization   Take 3 mLs (2.5 mg total) by nebulization every 4 (four) hours as needed for wheezing or shortness of breath.   30 vial   0   . benzonatate (TESSALON PERLES) 100 MG capsule   Oral   Take 1 capsule (100 mg total) by mouth every 6 (six) hours as needed for cough.   15 capsule   0   . FLUoxetine (PROZAC) 40 MG capsule   Oral   Take 40 mg by mouth daily.          . fluticasone (FLOVENT  HFA) 110 MCG/ACT inhaler   Inhalation   Inhale 1 puff into the lungs 2 (two) times daily.   1 Inhaler   12   . ibuprofen (ADVIL,MOTRIN) 600 MG tablet   Oral   Take 1 tablet (600 mg total) by mouth every 8 (eight) hours as needed. Patient taking differently: Take 600 mg by mouth every 8 (eight) hours as needed for headache or moderate pain.    20 tablet   0   . predniSONE (DELTASONE) 20 MG tablet      Take two tablets daily for seven days, then take one tablet daily   21 tablet   0   . predniSONE (DELTASONE) 20 MG tablet   Oral   Take 3 tablets (60 mg total) by mouth daily.   12 tablet   0     Allergies Penicillins and Sulfonamide derivatives  Family History  Problem Relation Age of Onset  . Diabetes Other   . Hypertension Other   . Cancer Maternal Aunt      Social History Social History  Substance Use Topics  . Smoking status: Current Every Day Smoker -- 1.00 packs/day for 20 years    Types: Cigarettes  . Smokeless tobacco: Never Used  . Alcohol Use: Yes     Comment: occ    Review of Systems Constitutional: No fever/chills Eyes: No visual changes. ENT: No sore throat. Cardiovascular: Denies chest pain. Respiratory: Cough and shortness of breath. Gastrointestinal: No abdominal pain.  No nausea, no vomiting.  No diarrhea.  No constipation. Genitourinary: Negative for dysuria. Musculoskeletal: Negative for back pain. Skin: Negative for rash. Neurological: Negative for headaches, focal weakness or numbness.  10-point ROS otherwise negative.  ____________________________________________   PHYSICAL EXAM:  VITAL SIGNS: ED Triage Vitals  Enc Vitals Group     BP 10/28/15 2358 147/86 mmHg     Pulse Rate 10/28/15 2358 106     Resp 10/28/15 2358 18     Temp 10/28/15 2358 98.1 F (36.7 C)     Temp Source 10/28/15 2358 Oral     SpO2 10/28/15 2358 95 %     Weight 10/28/15 2358 180 lb (81.647 kg)     Height 10/28/15 2358  (1.803 m)     Head Cir --      Peak Flow --      Pain Score --      Pain Loc --      Pain Edu? --      Excl. in GC? --     Constitutional: Alert and oriented. Well appearing and in Mild distress. Eyes: Conjunctivae are normal. PERRL. EOMI. Head: Atraumatic. Nose: No congestion/rhinnorhea. Mouth/Throat: Mucous membranes are moist.  Oropharynx non-erythematous. Cardiovascular: Normal rate, regular rhythm. Grossly normal heart sounds.  Good peripheral circulation. Respiratory: Normal respiratory effort.  No retractions. Expiratory wheezing throughout all lung fields Gastrointestinal: Soft and nontender. No distention. Positive bowel sounds Musculoskeletal: No lower extremity tenderness nor edema.   Neurologic:  Normal speech and language.  Skin:  Skin is warm, dry and intact.  Psychiatric: Mood and  affect are normal.   ____________________________________________   LABS (all labs ordered are listed, but only abnormal results are displayed)  Labs Reviewed - No data to display ____________________________________________  EKG  ED ECG REPORT I, Rebecka Apley, the attending physician, personally viewed and interpreted this ECG.   Date: 10/29/2015  EKG Time: 0004  Rate: 99  Rhythm: normal sinus rhythm  Axis: normal  Intervals:none  ST&T Change: none  ____________________________________________  RADIOLOGY  CXR: No active cardiopulmonary disease ____________________________________________   PROCEDURES  Procedure(s) performed: None  Procedures  Critical Care performed: No  ____________________________________________   INITIAL IMPRESSION / ASSESSMENT AND PLAN / ED COURSE  Pertinent labs & imaging results that were available during my care of the patient were reviewed by me and considered in my medical decision making (see chart for details).  After the breathing treatments the patient's shortness of breath is improved. The patient's oxygen saturation is also improved and went from 93% to 99%. The patient will be discharged to home and encouraged to follow-up with her primary care physician. She is also encouraged to get the albuterol which will help with some of her breathing problems. ____________________________________________   FINAL CLINICAL IMPRESSION(S) / ED DIAGNOSES  Final diagnoses:  Bronchitis  Wheezing  Shortness of breath      NEW MEDICATIONS STARTED DURING THIS VISIT:  New Prescriptions   ALBUTEROL (PROVENTIL HFA;VENTOLIN HFA) 108 (90 BASE) MCG/ACT INHALER    Inhale 2 puffs into the lungs every 6 (six) hours as needed.   BENZONATATE (TESSALON PERLES) 100 MG CAPSULE    Take 1 capsule (100 mg total) by mouth every 6 (six) hours as needed for cough.   PREDNISONE (DELTASONE) 20 MG TABLET    Take 3 tablets (60 mg total) by mouth daily.       Note:  This document was prepared using Dragon voice recognition software and may include unintentional dictation errors.    Rebecka Apley, MD 10/29/15 [phone]

## 2015-12-01 ENCOUNTER — Emergency Department: Payer: BLUE CROSS/BLUE SHIELD

## 2015-12-01 ENCOUNTER — Emergency Department
Admission: EM | Admit: 2015-12-01 | Discharge: 2015-12-01 | Disposition: A | Discharge status: Home / Self Care | Payer: BLUE CROSS/BLUE SHIELD | Attending: Emergency Medicine | Admitting: Emergency Medicine

## 2015-12-01 DIAGNOSIS — F1410 Cocaine abuse, uncomplicated: Secondary | ICD-10-CM | POA: Diagnosis not present

## 2015-12-01 DIAGNOSIS — F17210 Nicotine dependence, cigarettes, uncomplicated: Secondary | ICD-10-CM | POA: Diagnosis not present

## 2015-12-01 DIAGNOSIS — Z79899 Other long term (current) drug therapy: Secondary | ICD-10-CM | POA: Diagnosis not present

## 2015-12-01 DIAGNOSIS — R079 Chest pain, unspecified: Secondary | ICD-10-CM

## 2015-12-01 DIAGNOSIS — Z7951 Long term (current) use of inhaled steroids: Secondary | ICD-10-CM | POA: Diagnosis not present

## 2015-12-01 DIAGNOSIS — J40 Bronchitis, not specified as acute or chronic: Secondary | ICD-10-CM | POA: Insufficient documentation

## 2015-12-01 DIAGNOSIS — R0602 Shortness of breath: Secondary | ICD-10-CM | POA: Diagnosis present

## 2015-12-01 LAB — BASIC METABOLIC PANEL
Anion gap: 9 (ref 5–15)
BUN: 9 mg/dL (ref 6–20)
CALCIUM: 9.5 mg/dL (ref 8.9–10.3)
CO2: 26 mmol/L (ref 22–32)
CREATININE: 0.85 mg/dL (ref 0.44–1.00)
Chloride: 101 mmol/L (ref 101–111)
GFR calc Af Amer: >60 mL/min (ref >60)
GLUCOSE: 100 mg/dL — AB (ref 65–99)
Potassium: 3.2 mmol/L — ABNORMAL LOW (ref 3.5–5.1)
SODIUM: 136 mmol/L (ref 135–145)

## 2015-12-01 LAB — CBC
HCT: 41.2 % (ref 35.0–47.0)
Hemoglobin: 14.4 g/dL (ref 12.0–16.0)
MCH: 31.9 pg (ref 26.0–34.0)
MCHC: 34.9 g/dL (ref 32.0–36.0)
MCV: 91.3 fL (ref 80.0–100.0)
PLATELETS: 214 10*3/uL (ref 150–440)
RBC: 4.51 MIL/uL (ref 3.80–5.20)
RDW: 13.8 % (ref 11.5–14.5)
WBC: 8.7 10*3/uL (ref 3.6–11.0)

## 2015-12-01 LAB — POCT PREGNANCY, URINE: PREG TEST UR: NEGATIVE

## 2015-12-01 LAB — TROPONIN I: Troponin I: <0.03 ng/mL (ref <0.03)

## 2015-12-01 MED ORDER — ASPIRIN 81 MG PO CHEW
324.0000 mg | CHEWABLE_TABLET | Freq: Once | ORAL | Status: AC
  Administered 2015-12-01: 324 mg via ORAL

## 2015-12-01 MED ORDER — ALBUTEROL SULFATE HFA 108 (90 BASE) MCG/ACT IN AERS: 2.0000 | INHALATION_SPRAY | Freq: Four times a day (QID) | RESPIRATORY_TRACT | 2 refills | Status: DC | PRN

## 2015-12-01 MED ORDER — AZITHROMYCIN 500 MG PO TABS: 500.0000 mg | ORAL_TABLET | Freq: Every day | ORAL | 0 refills | Status: AC

## 2015-12-01 MED ORDER — IPRATROPIUM-ALBUTEROL 0.5-2.5 (3) MG/3ML IN SOLN
3.0000 mL | Freq: Once | RESPIRATORY_TRACT | Status: AC
  Administered 2015-12-01: 3 mL via RESPIRATORY_TRACT

## 2015-12-01 MED ORDER — ASPIRIN 81 MG PO CHEW
CHEWABLE_TABLET | ORAL | Status: AC
  Administered 2015-12-01: 324 mg via ORAL
  Filled 2015-12-01: qty 4

## 2015-12-01 MED ORDER — IPRATROPIUM-ALBUTEROL 0.5-2.5 (3) MG/3ML IN SOLN
RESPIRATORY_TRACT | Status: AC
  Filled 2015-12-01: qty 3

## 2015-12-01 NOTE — ED Notes (Signed)
Patient transported to X-ray 

## 2015-12-01 NOTE — ED Notes (Signed)
MD at bedside. 

## 2015-12-01 NOTE — ED Notes (Signed)
Report to Jenna, RN  

## 2015-12-01 NOTE — ED Provider Notes (Signed)
[facility] Emergency Department Provider Note  ____________________________________________   First MD Initiated Contact with Patient 12/01/15 0144     (approximate)  I have reviewed the triage vital signs and the nursing notes.   HISTORY  Chief Complaint Chest Pain and Shortness of Breath   HPI Alexandra Holt is a 40 y.o. female presents with acute onset of chest pain approximately 10:30 PM last night following smoking crack cocaine. Patient describes the discomfort as tightness. Patient states her heart rate was in the 150s while she was at home. Patient also concerned about the possibility of pneumonia states that she smokes a pack cigarettes per day and has had increasing coughing. Patient denies any cardiac history.   Past Medical History:  Diagnosis Date  . Anxiety   . Asthma   . Depression   . H/O: drug dependency (HCC)   . Migraine   . Narcotic addiction [facility](HCC)     Patient Active Problem List   Diagnosis Date Noted  . Weight gain 06/15/2013  . Bronchitis 06/12/2013  . GASTROENTERITIS 07/13/2009  . 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 CELLULITIS AND ABSCESS OF FACE 01/06/2008  . ARTHRITIS 01/06/2008  . NECK PAIN 06/09/2007  . DEPRESSION 01/01/2007  . DIZZINESS OR VERTIGO 01/01/2007  . HEADACHE 01/01/2007    Past Surgical History:  Procedure Laterality Date  . ECTOPIC PREGNANCY SURGERY    . TOOTH EXTRACTION      Prior to Admission medications   Medication Sig Start Date End Date Taking? Authorizing Provider  adalimumab (HUMIRA) 40 MG/0.8ML injection Inject 40 mg into the skin every 14 (fourteen) days.     Historical Provider, MD  albuterol (PROVENTIL HFA;VENTOLIN HFA) 108 (90 Base) MCG/ACT inhaler Inhale 2 puffs into the lungs every 6 (six) hours as needed for wheezing or shortness of breath. 07/22/15   Tommi Rumpshonda L Summers, PA-C  albuterol (PROVENTIL HFA;VENTOLIN HFA) 108 (90 Base) MCG/ACT inhaler Inhale 2 puffs into the  lungs every 6 (six) hours as needed. 10/29/15   Rebecka ApleyAllison P Webster, MD  albuterol (PROVENTIL) (2.5 MG/3ML) 0.083% nebulizer solution Take 3 mLs (2.5 mg total) by nebulization every 4 (four) hours as needed for wheezing or shortness of breath. 09/29/15   Dione Boozeavid Glick, MD  benzonatate (TESSALON PERLES) 100 MG capsule Take 1 capsule (100 mg total) by mouth every 6 (six) hours as needed for cough. 10/29/15   Rebecka ApleyAllison P Webster, MD  FLUoxetine (PROZAC) 40 MG capsule Take 40 mg by mouth daily.     Historical Provider, MD  fluticasone (FLOVENT HFA) 110 MCG/ACT inhaler Inhale 1 puff into the lungs 2 (two) times daily. 09/29/15   Dione Boozeavid Glick, MD  ibuprofen (ADVIL,MOTRIN) 600 MG tablet Take 1 tablet (600 mg total) by mouth every 8 (eight) hours as needed. Patient taking differently: Take 600 mg by mouth every 8 (eight) hours as needed for headache or moderate pain.  06/22/15   Governor Rooksebecca Lord, MD  predniSONE (DELTASONE) 20 MG tablet Take two tablets daily for seven days, then take one tablet daily 09/29/15   Dione Boozeavid Glick, MD  predniSONE (DELTASONE) 20 MG tablet Take 3 tablets (60 mg total) by mouth daily. 10/29/15   Rebecka ApleyAllison P Webster, MD    Allergies Penicillins and Sulfonamide derivatives  Family History  Problem Relation Age of Onset  . Diabetes Other   . Hypertension Other   . Cancer Maternal Aunt     Social History Social History  Substance Use Topics  . Smoking status: Current  Every Day Smoker    Packs/day: 1.00    Years: 20.00    Types: Cigarettes  . Smokeless tobacco: Never Used  . Alcohol use No     Comment: occ    Review of Systems Constitutional: No fever/chills Eyes: No visual changes. ENT: No sore throat. Cardiovascular: Positive for chest pain. Respiratory: Denies shortness of breath. Gastrointestinal: No abdominal pain.  No nausea, no vomiting.  No diarrhea.  No constipation. Genitourinary: Negative for dysuria. Musculoskeletal: Negative for back pain. Skin: Negative for  rash. Neurological: Negative for headaches, focal weakness or numbness.  10-point ROS otherwise negative.  ____________________________________________   PHYSICAL EXAM:  VITAL SIGNS: ED Triage Vitals  Enc Vitals Group     BP 12/01/15 0126 (!) 171/92     Pulse Rate 12/01/15 0126 79     Resp 12/01/15 0126 14     Temp 12/01/15 0126 98 F (36.7 C)     Temp Source 12/01/15 0126 Oral     SpO2 12/01/15 0126 100 %     Weight 12/01/15 0127 175 lb (79.4 kg)     Height 12/01/15 0127 5\' 10"  (1.778 m)     Head Circumference --      Peak Flow --      Pain Score 12/01/15 0133 8     Pain Loc --      Pain Edu? --      Excl. in GC? --     Constitutional: Alert and oriented. Well appearing and in no acute distress. Eyes: Conjunctivae are normal. PERRL. EOMI. Head: Atraumatic.Marland Kitchen Mouth/Throat: Mucous membranes are moist.  Oropharynx non-erythematous. Neck: No stridor.  No meningeal signs.   Cardiovascular: Normal rate, regular rhythm. Good peripheral circulation. Grossly normal heart sounds.   Respiratory: Normal respiratory effort.  No retractions. Lungs CTAB. Gastrointestinal: Soft and nontender. No distention.  Musculoskeletal: No lower extremity tenderness nor edema. No gross deformities of extremities. Neurologic:  Normal speech and language. No gross focal neurologic deficits are appreciated.  Skin:  Skin is warm, dry and intact. No rash noted. Psychiatric: Mood and affect are normal. Speech and behavior are normal.  ____________________________________________   LABS (all labs ordered are listed, but only abnormal results are displayed)  Labs Reviewed  BASIC METABOLIC PANEL - Abnormal; Notable for the following:       Result Value   Potassium 3.2 (*)    Glucose, Bld 100 (*)    All other components within normal limits  CBC  TROPONIN I  TROPONIN I  POCT PREGNANCY, URINE   ____________________________________________  EKG  ED ECG REPORT I, [place] N Fahima Cifelli, the  attending physician, personally viewed and interpreted this ECG.   Date: 12/01/2015  EKG Time: 1:24 AM  Rate: 80  Rhythm: Normal sinus rhythm  Axis: Normal  Intervals: Normal  ST&T Change: None  ____________________________________________  RADIOLOGY I, McIntyre N Allissa Albright, personally viewed and evaluated these images (plain radiographs) as part of my medical decision making, as well as reviewing the written report by the radiologist.  Dg Chest 2 View  Result Date: 12/01/2015 CLINICAL DATA:  Shortness of breath.  Right shoulder blade pain. EXAM: CHEST  2 VIEW COMPARISON:  10/29/2015. FINDINGS: The heart size and mediastinal contours are within normal limits. Both lungs are clear. The visualized skeletal structures are unremarkable. IMPRESSION: No active cardiopulmonary disease. Electronically Signed   By: Deatra Robinson M.D.   On: 12/01/2015 02:10     Procedures     INITIAL IMPRESSION / ASSESSMENT AND PLAN /  ED COURSE  Pertinent labs & imaging results that were available during my care of the patient were reviewed by me and considered in my medical decision making (see chart for details). Patient received aspirin 324 mg after my evaluation. EKG revealed no evidence of ST segment elevation or depression, troponin negative 2.  Clinical Course    ____________________________________________  FINAL CLINICAL IMPRESSION(S) / ED DIAGNOSES  Final diagnoses:  Chest pain, unspecified chest pain type  Bronchitis  Cocaine abuse     MEDICATIONS GIVEN DURING THIS VISIT:  Medications  aspirin chewable tablet 324 mg (324 mg Oral Given 12/01/15 0213)     NEW OUTPATIENT MEDICATIONS STARTED DURING THIS VISIT:  New Prescriptions   No medications on file      Note:  This document was prepared using Dragon voice recognition software and may include unintentional dictation errors.    Darci Current, MD 12/01/15 [phone]

## 2015-12-01 NOTE — ED Notes (Signed)
  Reviewed d/c instructions, follow-up care, and prescriptions with pt. Pt verbalized understanding 

## 2015-12-01 NOTE — ED Notes (Addendum)
Pt reports that she used cocaine at 1800 yesterday, states last use before then was 3 months prior. Pt states this is the first time she has smoked it. Pt tearful, stating "I did something so stupid".

## 2015-12-01 NOTE — ED Triage Notes (Signed)
Pt arrives to ER c/o CP and SOB that began approx 1030PM yesterday. Pt states CP to center of chest and described as tightness. Pt also c/o pain under right shoulder blade. Pt reports HR went up into 150's while at work. Pt concerned she has pnuemonia. RR even and unlabored at this time. Pt alert and oriented X4, active, cooperative, pt in NAD. RR even and unlabored, color WNL.

## 2015-12-06 ENCOUNTER — Emergency Department (HOSPITAL_COMMUNITY)
Admission: EM | Admit: 2015-12-06 | Discharge: 2015-12-06 | Disposition: A | Discharge status: Home / Self Care | Payer: BLUE CROSS/BLUE SHIELD | Attending: Emergency Medicine | Admitting: Emergency Medicine

## 2015-12-06 ENCOUNTER — Encounter (HOSPITAL_COMMUNITY): Payer: Self-pay | Admitting: Emergency Medicine

## 2015-12-06 ENCOUNTER — Emergency Department (HOSPITAL_COMMUNITY): Payer: BLUE CROSS/BLUE SHIELD

## 2015-12-06 DIAGNOSIS — J40 Bronchitis, not specified as acute or chronic: Secondary | ICD-10-CM | POA: Diagnosis present

## 2015-12-06 DIAGNOSIS — F17210 Nicotine dependence, cigarettes, uncomplicated: Secondary | ICD-10-CM | POA: Insufficient documentation

## 2015-12-06 DIAGNOSIS — J069 Acute upper respiratory infection, unspecified: Secondary | ICD-10-CM | POA: Insufficient documentation

## 2015-12-06 DIAGNOSIS — J45909 Unspecified asthma, uncomplicated: Secondary | ICD-10-CM | POA: Diagnosis not present

## 2015-12-06 MED ORDER — PREDNISONE 20 MG PO TABS: 40.0000 mg | ORAL_TABLET | Freq: Every day | ORAL | 0 refills | Status: DC

## 2015-12-06 MED ORDER — ALBUTEROL SULFATE HFA 108 (90 BASE) MCG/ACT IN AERS
1.0000 | INHALATION_SPRAY | Freq: Once | RESPIRATORY_TRACT | Status: AC
  Administered 2015-12-06: 1 via RESPIRATORY_TRACT
  Filled 2015-12-06: qty 6.7

## 2015-12-06 NOTE — ED Triage Notes (Signed)
Pt st's she was at [facility] On 8/12 and was dx with bronchitis.  Pt st's normally when she has bronchitis the only thing that will clear it is a inhaler and prednisone.  Pt st's she was not given prednisone and doesn't have the money to get Rx for inhaler.

## 2015-12-06 NOTE — ED Provider Notes (Signed)
[facility]-EMERGENCY DEPT Provider Note   CSN: [id number] Arrival date & time: 12/06/15  1450   By signing my name below, I, Freida Busman, attest that this documentation has been prepared under the direction and in the presence of non-physician practitioner, Audry Pili, PA-C. Electronically Signed: Freida Busman, Scribe. 12/06/2015. 3:45 PM.   History   Chief Complaint Chief Complaint  Patient presents with  . Bronchitis    The history is provided by the patient. No language interpreter was used.   HPI Comments:  Alexandra Holt is a 40 y.o. female who presents to the Emergency Department complaining of persistent cough x 1-2 weeks. Pt was seen at [facility] on 12/01/15 for the same, was diagnosed with Bronchitis and discharged with ZPack which as not provided any relief. She states with past bouts of bronchitis she has found relief from the combination of prednisone and an inhaler but she could not afford the inhaler and was not given prednisone at [facility]. Pt denies fever, nasal congestion, and sore throat. She notes sick contacts at work. She is a current smoker.   Past Medical History:  Diagnosis Date  . Anxiety   . Asthma   . Depression   . H/O: drug dependency (HCC)   . Migraine   . Narcotic addiction [facility])     Patient Active Problem List   Diagnosis Date Noted  . Weight gain 06/15/2013  . Bronchitis 06/12/2013  . GASTROENTERITIS 07/13/2009  . 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 CELLULITIS AND ABSCESS OF FACE 01/06/2008  . ARTHRITIS 01/06/2008  . NECK PAIN 06/09/2007  . DEPRESSION 01/01/2007  . DIZZINESS OR VERTIGO 01/01/2007  . HEADACHE 01/01/2007    Past Surgical History:  Procedure Laterality Date  . ECTOPIC PREGNANCY SURGERY    . TOOTH EXTRACTION      OB History    Gravida Para Term Preterm AB Living   7 1 1   6 1    SAB TAB Ectopic Multiple Live Births     4 2           Home Medications    Prior to Admission medications   Medication Sig Start  Date End Date Taking? Authorizing Provider  adalimumab (HUMIRA) 40 MG/0.8ML injection Inject 40 mg into the skin every 14 (fourteen) days.     Historical Provider, MD  albuterol (PROVENTIL HFA;VENTOLIN HFA) 108 (90 Base) MCG/ACT inhaler Inhale 2 puffs into the lungs every 6 (six) hours as needed for wheezing or shortness of breath. 07/22/15   Tommi Rumps, PA-C  albuterol (PROVENTIL HFA;VENTOLIN HFA) 108 (90 Base) MCG/ACT inhaler Inhale 2 puffs into the lungs every 6 (six) hours as needed. 10/29/15   Rebecka Apley, MD  albuterol (PROVENTIL HFA;VENTOLIN HFA) 108 (90 Base) MCG/ACT inhaler Inhale 2 puffs into the lungs every 6 (six) hours as needed for wheezing or shortness of breath. 12/01/15   Darci Current, MD  albuterol (PROVENTIL) (2.5 MG/3ML) 0.083% nebulizer solution Take 3 mLs (2.5 mg total) by nebulization every 4 (four) hours as needed for wheezing or shortness of breath. 09/29/15   Dione Booze, MD  benzonatate (TESSALON PERLES) 100 MG capsule Take 1 capsule (100 mg total) by mouth every 6 (six) hours as needed for cough. 10/29/15   Rebecka Apley, MD  FLUoxetine (PROZAC) 40 MG capsule Take 40 mg by mouth daily.     Historical Provider, MD  fluticasone (FLOVENT HFA) 110 MCG/ACT inhaler Inhale 1 puff into the lungs 2 (two) times daily. 09/29/15  Dione Boozeavid Glick, MD  ibuprofen (ADVIL,MOTRIN) 600 MG tablet Take 1 tablet (600 mg total) by mouth every 8 (eight) hours as needed. Patient taking differently: Take 600 mg by mouth every 8 (eight) hours as needed for headache or moderate pain.  06/22/15   Governor Rooksebecca Lord, MD  predniSONE (DELTASONE) 20 MG tablet Take two tablets daily for seven days, then take one tablet daily 09/29/15   Dione Boozeavid Glick, MD  predniSONE (DELTASONE) 20 MG tablet Take 3 tablets (60 mg total) by mouth daily. 10/29/15   Rebecka ApleyAllison P Webster, MD    Family History Family History  Problem Relation Age of Onset  . Diabetes Other   . Hypertension Other   . Cancer Maternal Aunt      Social History Social History  Substance Use Topics  . Smoking status: Current Every Day Smoker    Packs/day: 1.00    Years: 20.00    Types: Cigarettes  . Smokeless tobacco: Never Used  . Alcohol use No     Comment: occ     Allergies   Penicillins and Sulfonamide derivatives   Review of Systems Review of Systems  Constitutional: Negative for chills and fever.  Respiratory: Positive for cough.   Cardiovascular: Negative for chest pain.   Physical Exam Updated Vital Signs BP 119/74 (BP Location: Right Arm)   Pulse 87   Temp 98.2 F (36.8 C) (Oral)   Resp 18   Ht 5\' 10"  (1.778 m)   Wt 175 lb (79.4 kg)   LMP 12/01/2015 (Approximate)   SpO2 97%   BMI 25.11 kg/m   Physical Exam  Constitutional: She is oriented to person, place, and time. She appears well-developed and well-nourished. No distress.  HENT:  Head: Normocephalic and atraumatic.  Eyes: Conjunctivae are normal.  Cardiovascular: Normal rate.   Pulmonary/Chest: Effort normal and breath sounds normal. No respiratory distress. She has no wheezes. She has no rales.  Abdominal: She exhibits no distension.  Neurological: She is alert and oriented to person, place, and time.  Skin: Skin is warm and dry.  Psychiatric: She has a normal mood and affect.  Nursing note and vitals reviewed.  ED Treatments / Results  DIAGNOSTIC STUDIES:  Oxygen Saturation is 97% on RA, normal by my interpretation.    COORDINATION OF CARE:  3:48 PM Discussed treatment plan with pt at bedside and pt agreed to plan.  Labs (all labs ordered are listed, but only abnormal results are displayed) Labs Reviewed - No data to display  EKG  EKG Interpretation None       Radiology Dg Chest 2 View  Result Date: 12/06/2015 CLINICAL DATA:  Cough, fever, right back pain EXAM: CHEST  2 VIEW COMPARISON:  12/01/2015 FINDINGS: Cardiomediastinal silhouette is stable. Mild degenerative changes mid thoracic spine. No infiltrate or  pulmonary edema. IMPRESSION: No active cardiopulmonary disease. Electronically Signed   By: Natasha MeadLiviu  Pop M.D.   On: 12/06/2015 16:21    Procedures Procedures   Medications Ordered in ED Medications - No data to display   Initial Impression / Assessment and Plan / ED Course  I have reviewed the triage vital signs and the nursing notes.  Pertinent labs & imaging results that were available during my care of the patient were reviewed by me and considered in my medical decision making (see chart for details).  Clinical Course     Final Clinical Impressions(s) / ED Diagnoses  I have reviewed and evaluated the relevant imaging studies.  I have reviewed the relevant  previous healthcare records. I obtained HPI from historian.  ED Course:  Assessment: Pt is a 40yF who presents with cough x several weeks. Seen at [facility]. Given Zpack or Bronchitis. Notes symptoms continuing. Pt ntoes that she usually get prednisone and inhaler, which she did not receive. On exam, pt in NAD. Nontoxic/nonseptic appearing. VSS. Afebrile. Lungs CTA. Heart RRR. CXR negative. Given albuterol inhaler in ED. Rxed prednisone 5 day course. Plan is to DC home with follow up to PCP. At time of discharge, Patient is in no acute distress. Vital Signs are stable. Patient is able to ambulate. Patient able to tolerate PO.    Disposition/Plan:  DC Home Additional Verbal discharge instructions given and discussed with patient.  Pt Instructed to f/u with PCP in the next week for evaluation and treatment of symptoms. Return precautions given Pt acknowledges and agrees with plan  Supervising Physician Maia PlanJoshua G Long, MD   Final diagnoses:  URI (upper respiratory infection)    New Prescriptions New Prescriptions   No medications on file   I personally performed the services described in this documentation, which was scribed in my presence. The recorded information has been reviewed and is accurate.     Audry Piliyler Thornton Dohrmann,  PA-C 12/06/15 1629    Maia PlanJoshua G Long, MD 12/07/15 1019

## 2015-12-06 NOTE — Discharge Instructions (Signed)
Please read and follow all provided instructions.  Your diagnoses today include: No diagnosis found.  Tests performed today include: Vital signs. See below for your results today.   Medications prescribed:  Take as prescribed   Home care instructions:  Follow any educational materials contained in this packet.  Follow-up instructions: Please follow-up with your primary care provider for further evaluation of symptoms and treatment   Return instructions:  Please return to the Emergency Department if you do not get better, if you get worse, or new symptoms OR  - Fever (temperature greater than 101.75F)  - Bleeding that does not stop with holding pressure to the area    -Severe pain (please note that you may be more sore the day after your accident)  - Chest Pain  - Difficulty breathing  - Severe nausea or vomiting  - Inability to tolerate food and liquids  - Passing out  - Skin becoming red around your wounds  - Change in mental status (confusion or lethargy)  - New numbness or weakness    Please return if you have any other emergent concerns.  Additional Information:  Your vital signs today were: BP 119/74 (BP Location: Right Arm)    Pulse 87    Temp 98.2 F (36.8 C) (Oral)    Resp 18    Ht 5\' 10"  (1.778 m)    Wt 79.4 kg    LMP 12/01/2015 (Approximate)    SpO2 97%    BMI 25.11 kg/m  If your blood pressure (BP) was elevated above 135/85 this visit, please have this repeated by your doctor within one month. ---------------

## 2015-12-06 NOTE — ED Notes (Signed)
Pt stable, ambulatory, states understanding of discharge instructions 

## 2015-12-30 ENCOUNTER — Emergency Department
Admission: EM | Admit: 2015-12-30 | Discharge: 2015-12-30 | Disposition: A | Discharge status: Home / Self Care | Payer: BLUE CROSS/BLUE SHIELD | Source: Home / Self Care | Attending: Emergency Medicine | Admitting: Emergency Medicine

## 2015-12-30 ENCOUNTER — Encounter: Payer: Self-pay | Admitting: Emergency Medicine

## 2015-12-30 ENCOUNTER — Emergency Department
Admission: EM | Admit: 2015-12-30 | Discharge: 2015-12-30 | Disposition: A | Discharge status: Home / Self Care | Payer: BLUE CROSS/BLUE SHIELD | Attending: Emergency Medicine | Admitting: Emergency Medicine

## 2015-12-30 DIAGNOSIS — R002 Palpitations: Secondary | ICD-10-CM

## 2015-12-30 DIAGNOSIS — Z79899 Other long term (current) drug therapy: Secondary | ICD-10-CM

## 2015-12-30 DIAGNOSIS — J45909 Unspecified asthma, uncomplicated: Secondary | ICD-10-CM

## 2015-12-30 DIAGNOSIS — F17210 Nicotine dependence, cigarettes, uncomplicated: Secondary | ICD-10-CM | POA: Insufficient documentation

## 2015-12-30 LAB — COMPREHENSIVE METABOLIC PANEL
ALT: 17 U/L (ref 14–54)
AST: 21 U/L (ref 15–41)
Albumin: 4 g/dL (ref 3.5–5.0)
Alkaline Phosphatase: 38 U/L (ref 38–126)
Anion gap: 8 (ref 5–15)
BILIRUBIN TOTAL: 0.4 mg/dL (ref 0.3–1.2)
BUN: 10 mg/dL (ref 6–20)
CHLORIDE: 110 mmol/L (ref 101–111)
CO2: 21 mmol/L — AB (ref 22–32)
Calcium: 8.9 mg/dL (ref 8.9–10.3)
Creatinine, Ser: 0.68 mg/dL (ref 0.44–1.00)
GFR calc Af Amer: >60 mL/min (ref >60)
GFR calc non Af Amer: >60 mL/min (ref >60)
GLUCOSE: 113 mg/dL — AB (ref 65–99)
POTASSIUM: 3.7 mmol/L (ref 3.5–5.1)
SODIUM: 139 mmol/L (ref 135–145)
Total Protein: 6.6 g/dL (ref 6.5–8.1)

## 2015-12-30 LAB — CBC
HEMATOCRIT: 39.7 % (ref 35.0–47.0)
HEMOGLOBIN: 13.7 g/dL (ref 12.0–16.0)
MCH: 31.8 pg (ref 26.0–34.0)
MCHC: 34.6 g/dL (ref 32.0–36.0)
MCV: 92 fL (ref 80.0–100.0)
Platelets: 213 10*3/uL (ref 150–440)
RBC: 4.32 MIL/uL (ref 3.80–5.20)
RDW: 13.8 % (ref 11.5–14.5)
WBC: 5.9 10*3/uL (ref 3.6–11.0)

## 2015-12-30 LAB — TROPONIN I: Troponin I: <0.03 ng/mL (ref <0.03)

## 2015-12-30 MED ORDER — METOPROLOL TARTRATE 25 MG PO TABS: 25.0000 mg | ORAL_TABLET | Freq: Two times a day (BID) | ORAL | 0 refills | Status: DC

## 2015-12-30 NOTE — ED Triage Notes (Addendum)
Pt states she went outside to smoke a cigarette at work. Stated she started coughing and felt as though her heart was racing. Pt felt dizzy and lightheaded while the episode was going on. Pt advised she had an episode like this approximately 1 month ago after using cocaine but has not used any since then.

## 2015-12-30 NOTE — ED Notes (Addendum)
Attempt x2 Left antecub and right antecut for lab draw unsuccessful.  Lab called to come draw blood.

## 2015-12-30 NOTE — ED Triage Notes (Signed)
Patient reports seen last night for the same.  Reports continued episodes of rapid heart rate and states that it makes her very anxious.

## 2015-12-30 NOTE — ED Provider Notes (Signed)
[facility] Emergency Department Provider Note  ____________________________________________  Time seen: Approximately 6:23 AM  I have reviewed the triage vital signs and the nursing notes.   HISTORY  Chief Complaint Tachycardia    HPI Alexandra Holt is a 40 y.o. female who complains of palpitations that started this morning when she went outside from work to smoke a cigarette. She has been under a lot of stress recently and not been sleeping very well. He reports this happened before but that was in the setting of smoking crack cocaine. She reports not using any drugs in the last 24 hours. Smokes a pack a day. Symptoms have resolved prior to arrival in the emergency department. Denies any history of thyroid problems.     Past Medical History:  Diagnosis Date  . Anxiety   . Asthma   . Depression   . H/O: drug dependency (HCC)   . Migraine   . Narcotic addiction [facility])      Patient Active Problem List   Diagnosis Date Noted  . Weight gain 06/15/2013  . Bronchitis 06/12/2013  . GASTROENTERITIS 07/13/2009  . 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 CELLULITIS AND ABSCESS OF FACE 01/06/2008  . ARTHRITIS 01/06/2008  . NECK PAIN 06/09/2007  . DEPRESSION 01/01/2007  . DIZZINESS OR VERTIGO 01/01/2007  . HEADACHE 01/01/2007     Past Surgical History:  Procedure Laterality Date  . ECTOPIC PREGNANCY SURGERY    . TOOTH EXTRACTION       Prior to Admission medications   Medication Sig Start Date End Date Taking? Authorizing Provider  adalimumab (HUMIRA) 40 MG/0.8ML injection Inject 40 mg into the skin every 14 (fourteen) days.     Historical Provider, MD  albuterol (PROVENTIL HFA;VENTOLIN HFA) 108 (90 Base) MCG/ACT inhaler Inhale 2 puffs into the lungs every 6 (six) hours as needed for wheezing or shortness of breath. 07/22/15   Tommi Rumps, PA-C  albuterol (PROVENTIL HFA;VENTOLIN HFA) 108 (90 Base) MCG/ACT inhaler Inhale 2 puffs into the  lungs every 6 (six) hours as needed. 10/29/15   Rebecka Apley, MD  albuterol (PROVENTIL HFA;VENTOLIN HFA) 108 (90 Base) MCG/ACT inhaler Inhale 2 puffs into the lungs every 6 (six) hours as needed for wheezing or shortness of breath. 12/01/15   Darci Current, MD  albuterol (PROVENTIL) (2.5 MG/3ML) 0.083% nebulizer solution Take 3 mLs (2.5 mg total) by nebulization every 4 (four) hours as needed for wheezing or shortness of breath. 09/29/15   Dione Booze, MD  benzonatate (TESSALON PERLES) 100 MG capsule Take 1 capsule (100 mg total) by mouth every 6 (six) hours as needed for cough. 10/29/15   Rebecka Apley, MD  FLUoxetine (PROZAC) 40 MG capsule Take 40 mg by mouth daily.     Historical Provider, MD  fluticasone (FLOVENT HFA) 110 MCG/ACT inhaler Inhale 1 puff into the lungs 2 (two) times daily. 09/29/15   Dione Booze, MD  ibuprofen (ADVIL,MOTRIN) 600 MG tablet Take 1 tablet (600 mg total) by mouth every 8 (eight) hours as needed. Patient taking differently: Take 600 mg by mouth every 8 (eight) hours as needed for headache or moderate pain.  06/22/15   Governor Rooks, MD  metoprolol tartrate (LOPRESSOR) 25 MG tablet Take 1 tablet (25 mg total) by mouth 2 (two) times daily. 12/30/15 12/29/16  Sharman Cheek, MD  predniSONE (DELTASONE) 20 MG tablet Take 2 tablets (40 mg total) by mouth daily. 12/06/15   Audry Pili, PA-C     Allergies Penicillins  and Sulfonamide derivatives   Family History  Problem Relation Age of Onset  . Diabetes Other   . Hypertension Other   . Cancer Maternal Aunt     Social History Social History  Substance Use Topics  . Smoking status: Current Every Day Smoker    Packs/day: 1.00    Years: 20.00    Types: Cigarettes  . Smokeless tobacco: Never Used  . Alcohol use No    Review of Systems  Constitutional:   No fever or chills.  Cardiovascular:   No chest pain.Positive palpitations Respiratory:   No dyspnea or cough. Gastrointestinal:   Negative for abdominal  pain, vomiting and diarrhea.  Musculoskeletal:   Negative for focal pain or swelling Neurological:   Negative for headaches 10-point ROS otherwise negative.  ____________________________________________   PHYSICAL EXAM:  VITAL SIGNS: ED Triage Vitals  Enc Vitals Group     BP 12/30/15 0401 125/71     Pulse Rate 12/30/15 0401 99     Resp 12/30/15 0401 18     Temp 12/30/15 0401 97.4 F (36.3 C)     Temp Source 12/30/15 0401 Oral     SpO2 12/30/15 0401 99 %     Weight 12/30/15 0404 175 lb (79.4 kg)     Height 12/30/15 0404 5\' 10"  (1.778 m)     Head Circumference --      Peak Flow --      Pain Score 12/30/15 0511 0     Pain Loc --      Pain Edu? --      Excl. in GC? --     Vital signs reviewed, nursing assessments reviewed.   Constitutional:   Alert and oriented. Well appearing and in no distress. Eyes:   No scleral icterus. No conjunctival pallor. PERRL. EOMI.  No nystagmus. ENT   Head:   Normocephalic and atraumatic.   Nose:   No congestion/rhinnorhea. No septal hematoma   Mouth/Throat:   MMM, no pharyngeal erythema. No peritonsillar mass.    Neck:   No stridor. No SubQ emphysema. No meningismus. Hematological/Lymphatic/Immunilogical:   No cervical lymphadenopathy. Cardiovascular:   RRR. Symmetric bilateral radial and DP pulses.  No murmurs.  Respiratory:   Normal respiratory effort without tachypnea nor retractions. Breath sounds are clear and equal bilaterally. No wheezes/rales/rhonchi. Gastrointestinal:   Soft and nontender. Non distended. There is no CVA tenderness.  No rebound, rigidity, or guarding. Genitourinary:   deferred Musculoskeletal:   Nontender with normal range of motion in all extremities. No joint effusions.  No lower extremity tenderness.  No edema. Neurologic:   Normal speech and language.  CN 2-10 normal. Motor grossly intact. No gross focal neurologic deficits are appreciated.  Skin:    Skin is warm, dry and intact. No rash noted.  No  petechiae, purpura, or bullae.  ____________________________________________    LABS (pertinent positives/negatives) (all labs ordered are listed, but only abnormal results are displayed) Labs Reviewed - No data to display ____________________________________________   EKG  Interpreted by me Normal sinus rhythm rate of 89, normal axis intervals QRS ST segments and T waves.  ____________________________________________    RADIOLOGY    ____________________________________________   PROCEDURES Procedures  ____________________________________________   INITIAL IMPRESSION / ASSESSMENT AND PLAN / ED COURSE  Pertinent labs & imaging results that were available during my care of the patient were reviewed by me and considered in my medical decision making (see chart for details).  Patient's well-appearing no acute distress, presents with a brief  episode of palpitations, possibly related to smoking versus stress and sleep deprivation. We'll have the patient try low-dose metoprolol and follow up with cardiology.     Clinical Course   ____________________________________________   FINAL CLINICAL IMPRESSION(S) / ED DIAGNOSES  Final diagnoses:  Palpitations       Portions of this note were generated with dragon dictation software. Dictation errors may occur despite best attempts at proofreading.    Sharman Cheek, MD 12/30/15 [phone]

## 2015-12-30 NOTE — Discharge Instructions (Signed)
Please follow up with cardiology as instructed

## 2015-12-30 NOTE — ED Provider Notes (Signed)
[facility] Emergency Department Provider Note   ____________________________________________    I have reviewed the triage vital signs and the nursing notes.   HISTORY  Chief Complaint Tachycardia     HPI Alexandra Holt is a 40 y.o. female who presents with complaints of palpitations. Patient reports she was seen in the emergency department this morning for palpitations. She was prescribed Lopressor which she has only had one dose. She describes an additional episode of palpitations at approximately 7:30 PM tonight, it was again related to smoking a cigarette. It resolved without intervention quickly after starting. She denies chest pain. She denies dizziness. She denies shortness of breath. She denies drug use. She reports she took her pulse and it was 166 during her palpitations. Past Medical History:  Diagnosis Date  . Anxiety   . Asthma   . Depression   . H/O: drug dependency (HCC)   . Migraine   . Narcotic addiction [facility])     Patient Active Problem List   Diagnosis Date Noted  . Weight gain 06/15/2013  . Bronchitis 06/12/2013  . GASTROENTERITIS 07/13/2009  . 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 CELLULITIS AND ABSCESS OF FACE 01/06/2008  . ARTHRITIS 01/06/2008  . NECK PAIN 06/09/2007  . DEPRESSION 01/01/2007  . DIZZINESS OR VERTIGO 01/01/2007  . HEADACHE 01/01/2007    Past Surgical History:  Procedure Laterality Date  . ECTOPIC PREGNANCY SURGERY    . TOOTH EXTRACTION      Prior to Admission medications   Medication Sig Start Date End Date Taking? Authorizing Provider  adalimumab (HUMIRA) 40 MG/0.8ML injection Inject 40 mg into the skin every 14 (fourteen) days.     Historical Provider, MD  albuterol (PROVENTIL HFA;VENTOLIN HFA) 108 (90 Base) MCG/ACT inhaler Inhale 2 puffs into the lungs every 6 (six) hours as needed for wheezing or shortness of breath. 07/22/15   Tommi Rumps, PA-C  albuterol (PROVENTIL HFA;VENTOLIN  HFA) 108 (90 Base) MCG/ACT inhaler Inhale 2 puffs into the lungs every 6 (six) hours as needed. 10/29/15   Rebecka Apley, MD  albuterol (PROVENTIL HFA;VENTOLIN HFA) 108 (90 Base) MCG/ACT inhaler Inhale 2 puffs into the lungs every 6 (six) hours as needed for wheezing or shortness of breath. 12/01/15   Darci Current, MD  albuterol (PROVENTIL) (2.5 MG/3ML) 0.083% nebulizer solution Take 3 mLs (2.5 mg total) by nebulization every 4 (four) hours as needed for wheezing or shortness of breath. 09/29/15   Dione Booze, MD  benzonatate (TESSALON PERLES) 100 MG capsule Take 1 capsule (100 mg total) by mouth every 6 (six) hours as needed for cough. 10/29/15   Rebecka Apley, MD  FLUoxetine (PROZAC) 40 MG capsule Take 40 mg by mouth daily.     Historical Provider, MD  fluticasone (FLOVENT HFA) 110 MCG/ACT inhaler Inhale 1 puff into the lungs 2 (two) times daily. 09/29/15   Dione Booze, MD  ibuprofen (ADVIL,MOTRIN) 600 MG tablet Take 1 tablet (600 mg total) by mouth every 8 (eight) hours as needed. Patient taking differently: Take 600 mg by mouth every 8 (eight) hours as needed for headache or moderate pain.  06/22/15   Governor Rooks, MD  metoprolol tartrate (LOPRESSOR) 25 MG tablet Take 1 tablet (25 mg total) by mouth 2 (two) times daily. 12/30/15 12/29/16  Sharman Cheek, MD  predniSONE (DELTASONE) 20 MG tablet Take 2 tablets (40 mg total) by mouth daily. 12/06/15   Audry Pili, PA-C     Allergies Penicillins and  Sulfonamide derivatives  Family History  Problem Relation Age of Onset  . Diabetes Other   . Hypertension Other   . Cancer Maternal Aunt     Social History Social History  Substance Use Topics  . Smoking status: Current Every Day Smoker    Packs/day: 1.00    Years: 20.00    Types: Cigarettes  . Smokeless tobacco: Never Used  . Alcohol use No    Review of Systems  Constitutional: No fever/chills Eyes: No visual changes.   Cardiovascular:Palpitations as above Respiratory: Denies  shortness of breath. Gastrointestinal: No abdominal pain.  No nausea, no vomiting.    Musculoskeletal: Negative for back pain.  Neurological: Negative for headaches   10-point ROS otherwise negative.  ____________________________________________   PHYSICAL EXAM:  VITAL SIGNS: ED Triage Vitals  Enc Vitals Group     BP 12/30/15 2026 (!) 145/65     Pulse Rate 12/30/15 2026 83     Resp 12/30/15 2026 20     Temp 12/30/15 2026 98.4 F (36.9 C)     Temp Source 12/30/15 2026 Oral     SpO2 12/30/15 2026 100 %     Weight --      Height --      Head Circumference --      Peak Flow --      Pain Score 12/30/15 2231 0     Pain Loc --      Pain Edu? --      Excl. in GC? --     Constitutional: Alert and oriented. No acute distress. Eyes: Conjunctivae are normal.  Head: Atraumatic. Nose: No congestion/rhinnorhea. Mouth/Throat: Mucous membranes are moist.    Cardiovascular: Normal rate, regular rhythm. Grossly normal heart sounds.  Good peripheral circulation. Respiratory: Normal respiratory effort.  No retractions. Lungs CTAB. Gastrointestinal: Soft and nontender. No distention.  No CVA tenderness. Genitourinary: deferred Musculoskeletal: No lower extremity tenderness nor edema.   Neurologic:  Normal speech and language. No gross focal neurologic deficits are appreciated.  Skin:  Skin is warm, dry and intact. No rash noted. Psychiatric: Mood and affect are normal. Speech and behavior are normal.  ____________________________________________   LABS (all labs ordered are listed, but only abnormal results are displayed)  Labs Reviewed  COMPREHENSIVE METABOLIC PANEL - Abnormal; Notable for the following:       Result Value   CO2 21 (*)    Glucose, Bld 113 (*)    All other components within normal limits  CBC  TROPONIN I   ____________________________________________  EKG  ED ECG REPORT I, Jene EveryKINNER, Roberto Romanoski, the attending physician, personally viewed and interpreted this  ECG.  Date: 12/30/2015 EKG Time: 819 Rate: 82 Rhythm: normal sinus rhythm QRS Axis: Left axis deviation Intervals: normal ST/T Wave abnormalities: normal Conduction Disturbances: none No delta wave  ____________________________________________  RADIOLOGY  None ____________________________________________   PROCEDURES  Procedure(s) performed: No    Critical Care performed: No ____________________________________________   INITIAL IMPRESSION / ASSESSMENT AND PLAN / ED COURSE  Pertinent labs & imaging results that were available during my care of the patient were reviewed by me and considered in my medical decision making (see chart for details).  Patient well-appearing and asymptomatic in the emergency department. Lab work is unremarkable. EKG does not demonstrate delta wave or A. fib. I suspect SVT as a cause of her palpitations. Continue Lopressor and follow-up with cardiology.  Clinical Course   ____________________________________________   FINAL CLINICAL IMPRESSION(S) / ED DIAGNOSES  Final diagnoses:  Palpitations  NEW MEDICATIONS STARTED DURING THIS VISIT:  Discharge Medication List as of 12/30/2015 10:29 PM       Note:  This document was prepared using Dragon voice recognition software and may include unintentional dictation errors.    Jene Every, MD 12/30/15 [phone]

## 2015-12-31 ENCOUNTER — Emergency Department
Admission: EM | Admit: 2015-12-31 | Discharge: 2015-12-31 | Disposition: A | Discharge status: Home / Self Care | Payer: BLUE CROSS/BLUE SHIELD | Attending: Student in an Organized Health Care Education/Training Program | Admitting: Student in an Organized Health Care Education/Training Program

## 2015-12-31 ENCOUNTER — Encounter: Payer: Self-pay | Admitting: Emergency Medicine

## 2015-12-31 DIAGNOSIS — J45909 Unspecified asthma, uncomplicated: Secondary | ICD-10-CM | POA: Insufficient documentation

## 2015-12-31 DIAGNOSIS — F17210 Nicotine dependence, cigarettes, uncomplicated: Secondary | ICD-10-CM | POA: Diagnosis not present

## 2015-12-31 DIAGNOSIS — R002 Palpitations: Secondary | ICD-10-CM | POA: Diagnosis not present

## 2015-12-31 DIAGNOSIS — Z5321 Procedure and treatment not carried out due to patient leaving prior to being seen by health care provider: Secondary | ICD-10-CM | POA: Insufficient documentation

## 2015-12-31 NOTE — ED Triage Notes (Signed)
Intermittent tachycardia x 2 days, feels pulse up to 160. Denies sensation.

## 2016-01-12 ENCOUNTER — Emergency Department (HOSPITAL_COMMUNITY): Payer: BLUE CROSS/BLUE SHIELD

## 2016-01-12 ENCOUNTER — Emergency Department (HOSPITAL_COMMUNITY)
Admission: EM | Admit: 2016-01-12 | Discharge: 2016-01-12 | Disposition: A | Discharge status: Home / Self Care | Payer: BLUE CROSS/BLUE SHIELD | Attending: Emergency Medicine | Admitting: Emergency Medicine

## 2016-01-12 ENCOUNTER — Encounter (HOSPITAL_COMMUNITY): Payer: Self-pay | Admitting: Emergency Medicine

## 2016-01-12 DIAGNOSIS — J45909 Unspecified asthma, uncomplicated: Secondary | ICD-10-CM | POA: Diagnosis not present

## 2016-01-12 DIAGNOSIS — F17210 Nicotine dependence, cigarettes, uncomplicated: Secondary | ICD-10-CM | POA: Insufficient documentation

## 2016-01-12 DIAGNOSIS — R002 Palpitations: Secondary | ICD-10-CM | POA: Insufficient documentation

## 2016-01-12 LAB — CBC WITH DIFFERENTIAL/PLATELET
BASOS ABS: 0.1 10*3/uL (ref 0.0–0.1)
BASOS PCT: 1 %
Eosinophils Absolute: 0.5 10*3/uL (ref 0.0–0.7)
Eosinophils Relative: 5 %
HEMATOCRIT: 39.8 % (ref 36.0–46.0)
HEMOGLOBIN: 12.8 g/dL (ref 12.0–15.0)
LYMPHS PCT: 26 %
Lymphs Abs: 2.3 10*3/uL (ref 0.7–4.0)
MCH: 30.4 pg (ref 26.0–34.0)
MCHC: 32.2 g/dL (ref 30.0–36.0)
MCV: 94.5 fL (ref 78.0–100.0)
MONO ABS: 0.4 10*3/uL (ref 0.1–1.0)
Monocytes Relative: 5 %
NEUTROS ABS: 5.8 10*3/uL (ref 1.7–7.7)
NEUTROS PCT: 63 %
Platelets: 210 10*3/uL (ref 150–400)
RBC: 4.21 MIL/uL (ref 3.87–5.11)
RDW: 13.3 % (ref 11.5–15.5)
WBC: 9.1 10*3/uL (ref 4.0–10.5)

## 2016-01-12 LAB — URINALYSIS, ROUTINE W REFLEX MICROSCOPIC
BILIRUBIN URINE: NEGATIVE
Glucose, UA: NEGATIVE mg/dL
Hgb urine dipstick: NEGATIVE
Ketones, ur: NEGATIVE mg/dL
Leukocytes, UA: NEGATIVE
NITRITE: NEGATIVE
PROTEIN: NEGATIVE mg/dL
SPECIFIC GRAVITY, URINE: 1.006 (ref 1.005–1.030)
pH: 6.5 (ref 5.0–8.0)

## 2016-01-12 LAB — RAPID URINE DRUG SCREEN, HOSP PERFORMED
AMPHETAMINES: NOT DETECTED
Barbiturates: NOT DETECTED
Benzodiazepines: NOT DETECTED
COCAINE: NOT DETECTED
OPIATES: NOT DETECTED
TETRAHYDROCANNABINOL: NOT DETECTED

## 2016-01-12 LAB — BASIC METABOLIC PANEL
ANION GAP: 10 (ref 5–15)
BUN: 11 mg/dL (ref 6–20)
CALCIUM: 9 mg/dL (ref 8.9–10.3)
CHLORIDE: 106 mmol/L (ref 101–111)
CO2: 22 mmol/L (ref 22–32)
Creatinine, Ser: 0.66 mg/dL (ref 0.44–1.00)
GFR calc Af Amer: >60 mL/min (ref >60)
GFR calc non Af Amer: >60 mL/min (ref >60)
GLUCOSE: 117 mg/dL — AB (ref 65–99)
Potassium: 4 mmol/L (ref 3.5–5.1)
Sodium: 138 mmol/L (ref 135–145)

## 2016-01-12 LAB — TROPONIN I: Troponin I: <0.03 ng/mL (ref <0.03)

## 2016-01-12 LAB — POC URINE PREG, ED: PREG TEST UR: NEGATIVE

## 2016-01-12 LAB — D-DIMER, QUANTITATIVE (NOT AT ARMC): D DIMER QUANT: 0.62 ug{FEU}/mL — AB (ref 0.00–0.50)

## 2016-01-12 MED ORDER — HYDROXYZINE HCL 25 MG PO TABS
25.0000 mg | ORAL_TABLET | Freq: Once | ORAL | Status: DC
  Filled 2016-01-12: qty 1

## 2016-01-12 MED ORDER — IOPAMIDOL (ISOVUE-370) INJECTION 76%
INTRAVENOUS | Status: AC
  Administered 2016-01-12: 70 mL via INTRAVENOUS
  Filled 2016-01-12: qty 100

## 2016-01-12 NOTE — Discharge Instructions (Signed)
Continue to take your medications. Followup with the cardiologist. Return to the ED if you develop new or worsening symptoms.

## 2016-01-12 NOTE — ED Notes (Signed)
Patient transported to X-ray 

## 2016-01-12 NOTE — ED Notes (Signed)
Entered pts room to give her vistaril.  Pt asked what dose she was being given because it is what she already takes. When told it was 25 mg she pulled out her pill bottle and stated she had a higher dose with her, pulled a pill out and took it then requested the 25 mg ordered as well.  Pt was told she could not take both to which pt rolled her eyes.  Notified Dr Manus Gunningancour.

## 2016-01-12 NOTE — ED Provider Notes (Signed)
[facility]-EMER[facility]ENCY DEPT Provider Note   CSN: [id number] Arrival date & time: 01/12/16  0825     History   Chief Complaint Chief Complaint  Patient presents with  . Palpitations    HPI Alexandra Holt is a 40 y.o. female.  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 Reports they last about 2 hours and have since resolved. Denies chest pain or shortness of breath. She has had intermittent palpitations for the past month and seen in the ED 2. He was started on metoprolol which she is taking twice daily. She is told by her PCP that this could be secondary to anxiety. The first time she had palpitations was in the setting of cocaine use but she denies any cocaine use for the past 10 days. Denies any leg pain or leg swelling. Denies any nausea vomiting or fever. She feels improved since arriving in the ED. Reports her HR was 140s when she checked it at work.   The history is provided by the patient and the EMS personnel.  Palpitations   Pertinent negatives include no fever, no chest pain, no abdominal pain, no nausea, no vomiting, no dizziness, no weakness and no shortness of breath.    Past Medical History:  Diagnosis Date  . Anxiety   . Asthma   . Depression   . H/O: drug dependency (HCC)   . Migraine   . Narcotic addiction [facility])     Patient Active Problem List   Diagnosis Date Noted  . Weight gain 06/15/2013  . Bronchitis 06/12/2013  . GASTROENTERITIS 07/13/2009  . 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 CELLULITIS AND ABSCESS OF FACE 01/06/2008  . ARTHRITIS 01/06/2008  . NECK PAIN 06/09/2007  . DEPRESSION 01/01/2007  . DIZZINESS OR VERTIGO 01/01/2007  . HEADACHE 01/01/2007    Past Surgical History:  Procedure Laterality Date  . ECTOPIC PREGNANCY SURGERY    . TOOTH EXTRACTION      OB History    Gravida Para Term Preterm AB Living   7 1 1   6 1    SAB TAB Ectopic Multiple Live Births     4 2            Home Medications    Prior to Admission medications   Medication Sig Start Date End Date Taking? Authorizing Provider  adalimumab (HUMIRA) 40 MG/0.8ML injection Inject 40 mg into the skin every 14 (fourteen) days.     Historical Provider, MD  albuterol (PROVENTIL HFA;VENTOLIN HFA) 108 (90 Base) MCG/ACT inhaler Inhale 2 puffs into the lungs every 6 (six) hours as needed for wheezing or shortness of breath. 07/22/15   Tommi Rumps, PA-C  albuterol (PROVENTIL HFA;VENTOLIN HFA) 108 (90 Base) MCG/ACT inhaler Inhale 2 puffs into the lungs every 6 (six) hours as needed. 10/29/15   Rebecka Apley, MD  albuterol (PROVENTIL HFA;VENTOLIN HFA) 108 (90 Base) MCG/ACT inhaler Inhale 2 puffs into the lungs every 6 (six) hours as needed for wheezing or shortness of breath. 12/01/15   Darci Current, MD  albuterol (PROVENTIL) (2.5 MG/3ML) 0.083% nebulizer solution Take 3 mLs (2.5 mg total) by nebulization every 4 (four) hours as needed for wheezing or shortness of breath. 09/29/15   Dione Booze, MD  benzonatate (TESSALON PERLES) 100 MG capsule Take 1 capsule (100 mg total) by mouth every 6 (six) hours as needed for cough. 10/29/15   Rebecka Apley, MD  FLUoxetine [facility])  40 MG capsule Take 40 mg by mouth daily.     Historical Provider, MD  fluticasone (FLOVENT HFA) 110 MCG/ACT inhaler Inhale 1 puff into the lungs 2 (two) times daily. 09/29/15   Dione Boozeavid Glick, MD  ibuprofen (ADVIL,MOTRIN) 600 MG tablet Take 1 tablet (600 mg total) by mouth every 8 (eight) hours as needed. Patient taking differently: Take 600 mg by mouth every 8 (eight) hours as needed for headache or moderate pain.  06/22/15   Governor Rooksebecca Lord, MD  metoprolol tartrate (LOPRESSOR) 25 MG tablet Take 1 tablet (25 mg total) by mouth 2 (two) times daily. 12/30/15 12/29/16  Sharman CheekPhillip Stafford, MD  predniSONE (DELTASONE) 20 MG tablet Take 2 tablets (40 mg total) by mouth daily. 12/06/15   Audry Piliyler Mohr, PA-C    Family History Family History  Problem  Relation Age of Onset  . Diabetes Other   . Hypertension Other   . Cancer Maternal Aunt     Social History Social History  Substance Use Topics  . Smoking status: Current Every Day Smoker    Packs/day: 1.00    Years: 20.00    Types: Cigarettes  . Smokeless tobacco: Never Used  . Alcohol use No     Allergies   Penicillins and Sulfonamide derivatives   Review of Systems Review of Systems  Constitutional: Negative for activity change, appetite change and fever.  HENT: Negative for congestion and rhinorrhea.   Eyes: Negative for photophobia and visual disturbance.  Respiratory: Positive for chest tightness. Negative for shortness of breath and wheezing.   Cardiovascular: Positive for palpitations. Negative for chest pain.  Gastrointestinal: Negative for abdominal pain, nausea and vomiting.  Genitourinary: Negative for dysuria, hematuria, vaginal bleeding and vaginal discharge.  Musculoskeletal: Negative for arthralgias and myalgias.  Skin: Negative for rash.  Neurological: Negative for dizziness, seizures and weakness.   A complete 10 system review of systems was obtained and all systems are negative except as noted in the HPI and PMH.    Physical Exam Updated Vital Signs BP 123/90   Pulse 70   Resp 13   LMP 12/27/2015 (Exact Date)   SpO2 99%   Physical Exam  Constitutional: She is oriented to person, place, and time. She appears well-developed and well-nourished. No distress.  HENT:  Head: Normocephalic and atraumatic.  Mouth/Throat: Oropharynx is clear and moist. No oropharyngeal exudate.  Eyes: Conjunctivae and EOM are normal. Pupils are equal, round, and reactive to light.  Neck: Normal range of motion. Neck supple.  No meningismus.  Cardiovascular: Normal rate, regular rhythm, normal heart sounds and intact distal pulses.   No murmur heard. Pulmonary/Chest: Effort normal and breath sounds normal. No respiratory distress.  Abdominal: Soft. There is no  tenderness. There is no rebound and no guarding.  Musculoskeletal: Normal range of motion. She exhibits no edema or tenderness.  Neurological: She is alert and oriented to person, place, and time. No cranial nerve deficit. She exhibits normal muscle tone. Coordination normal.   5/5 strength throughout. CN 2-12 intact.Equal grip strength.   Skin: Skin is warm.  Psychiatric: She has a normal mood and affect. Her behavior is normal.  Nursing note and vitals reviewed.    ED Treatments / Results  Labs (all labs ordered are listed, but only abnormal results are displayed) Labs Reviewed  BASIC METABOLIC PANEL - Abnormal; Notable for the following:       Result Value   Glucose, Bld 117 (*)    All other components within normal limits  D-DIMER,  QUANTITATIVE (NOT AT [facility]) - Abnormal; Notable for the following:    D-Dimer, Quant 0.62 (*)    All other components within normal limits  URINALYSIS, ROUTINE W REFLEX MICROSCOPIC (NOT AT [facility])  URINE RAPID DRUG SCREEN, HOSP PERFORMED  TROPONIN I  CBC WITH DIFFERENTIAL/PLATELET  CBC WITH DIFFERENTIAL/PLATELET  POC URINE PREG, ED    EKG  EKG Interpretation  Date/Time:  Saturday January 12 2016 08:45:38 EDT Ventricular Rate:  70 PR Interval:    QRS Duration: 96 QT Interval:  402 QTC Calculation: 434 R Axis:   36 Text Interpretation:  Sinus rhythm Probable anteroseptal infarct, old No significant change was found Confirmed by Manus Gunning  MD, Tamina Cyphers [phone]) on 01/12/2016 8:55:03 AM       Radiology Dg Chest 2 View  Result Date: 01/12/2016 CLINICAL DATA:  Chest pains. Palpitations. Dizziness. Near syncope this morning. Smoker. EXAM: CHEST  2 VIEW COMPARISON:  12/06/2015. FINDINGS: Decreased inspiration. Normal sized heart. Clear lungs with normal vascularity. Mild thoracic spine degenerative changes. IMPRESSION: No acute abnormality. Electronically Signed   By: Beckie Salts M.D.   On: 01/12/2016 10:04   Ct Angio Chest Pe W And/or Wo  Contrast  Result Date: 01/12/2016 CLINICAL DATA:  Upper chest pain, shortness of breath, palpitations EXAM: CT ANGIOGRAPHY CHEST WITH CONTRAST TECHNIQUE: Multidetector CT imaging of the chest was performed using the standard protocol during bolus administration of intravenous contrast. Multiplanar CT image reconstructions and MIPs were obtained to evaluate the vascular anatomy. CONTRAST:  70 mL Isovue 370 IV COMPARISON:  Chest radiographs dated 01/12/2016 FINDINGS: Cardiovascular: Satisfactory opacification of the pulmonary arteries to the lobar level. No evidence of pulmonary embolism. Although not tailored for evaluation of the thoracic aorta, there is no evidence of thoracic aortic aneurysm or dissection. The heart is normal in size.  No pericardial effusion. Mediastinum/Nodes: No suspicious mediastinal, hilar, or axillary lymphadenopathy. Visualized thyroid is unremarkable. Lungs/Pleura: Visualized lungs are essentially clear. No suspicious pulmonary nodules. Mild dependent atelectasis in the bilateral lower lobes. Mild paraseptal emphysematous changes, upper lobe predominant. No focal consolidation. No pleural effusion or pneumothorax. Upper Abdomen: Visualized upper abdomen is within normal limits. Musculoskeletal: Mild degenerative changes of the visualized thoracolumbar spine. Review of the MIP images confirms the above findings. IMPRESSION: No evidence of pulmonary embolism. No evidence of acute cardiopulmonary disease. Electronically Signed   By: Charline Bills M.D.   On: 01/12/2016 13:17    Procedures Procedures (including critical care time)  Medications Ordered in ED Medications - No data to display   Initial Impression / Assessment and Plan / ED Course  I have reviewed the triage vital signs and the nursing notes.  Pertinent labs & imaging results that were available during my care of the patient were reviewed by me and considered in my medical decision making (see chart for  details).  Clinical Course   Recurrent palpitations. Now resolved. EKG with normal sinus rhythm, no delta wave  Labs reassuring. Troponin negative. D-dimer positive.  CTPE negative.  No further palpitations in the ED> drug screen negative for cocaine.  FOllow up with cardiology for holter monitor. Continue metoprolol. Return precautions discussed.  Final Clinical Impressions(s) / ED Diagnoses   Final diagnoses:  Palpitations    New Prescriptions New Prescriptions   No medications on file     Glynn Octave, MD 01/12/16 1749

## 2016-01-12 NOTE — ED Triage Notes (Signed)
Pt from [facility] via [employer] with c/o heart fluttering after working for the last 14 hours with mild increased SOB.  Pt reports hx of the same with PCP believing it is related to anxiety.  Pt's heart flutter became worse when EMS attempted to start an IV.  Pt ambulatory to room, NAD, A&O.

## 2016-01-24 ENCOUNTER — Emergency Department
Admission: EM | Admit: 2016-01-24 | Discharge: 2016-01-24 | Disposition: A | Discharge status: Home / Self Care | Payer: BLUE CROSS/BLUE SHIELD | Attending: Emergency Medicine | Admitting: Emergency Medicine

## 2016-01-24 ENCOUNTER — Emergency Department: Payer: BLUE CROSS/BLUE SHIELD

## 2016-01-24 ENCOUNTER — Encounter: Payer: Self-pay | Admitting: Emergency Medicine

## 2016-01-24 DIAGNOSIS — R0789 Other chest pain: Secondary | ICD-10-CM | POA: Insufficient documentation

## 2016-01-24 DIAGNOSIS — F17210 Nicotine dependence, cigarettes, uncomplicated: Secondary | ICD-10-CM | POA: Diagnosis not present

## 2016-01-24 DIAGNOSIS — Z5181 Encounter for therapeutic drug level monitoring: Secondary | ICD-10-CM | POA: Insufficient documentation

## 2016-01-24 DIAGNOSIS — F410 Panic disorder [episodic paroxysmal anxiety] without agoraphobia: Secondary | ICD-10-CM | POA: Insufficient documentation

## 2016-01-24 DIAGNOSIS — J45909 Unspecified asthma, uncomplicated: Secondary | ICD-10-CM | POA: Diagnosis not present

## 2016-01-24 DIAGNOSIS — F419 Anxiety disorder, unspecified: Secondary | ICD-10-CM

## 2016-01-24 LAB — URINE DRUG SCREEN, QUALITATIVE (ARMC ONLY)
AMPHETAMINES, UR SCREEN: NOT DETECTED
Barbiturates, Ur Screen: NOT DETECTED
Benzodiazepine, Ur Scrn: NOT DETECTED
Cannabinoid 50 Ng, Ur ~~LOC~~: NOT DETECTED
Cocaine Metabolite,Ur ~~LOC~~: NOT DETECTED
MDMA (ECSTASY) UR SCREEN: NOT DETECTED
METHADONE SCREEN, URINE: NOT DETECTED
OPIATE, UR SCREEN: NOT DETECTED
PHENCYCLIDINE (PCP) UR S: NOT DETECTED
Tricyclic, Ur Screen: NOT DETECTED

## 2016-01-24 LAB — BASIC METABOLIC PANEL
ANION GAP: 5 (ref 5–15)
BUN: 16 mg/dL (ref 6–20)
CHLORIDE: 104 mmol/L (ref 101–111)
CO2: 30 mmol/L (ref 22–32)
Calcium: 9.3 mg/dL (ref 8.9–10.3)
Creatinine, Ser: 0.88 mg/dL (ref 0.44–1.00)
GFR calc non Af Amer: >60 mL/min (ref >60)
Glucose, Bld: 85 mg/dL (ref 65–99)
POTASSIUM: 4.3 mmol/L (ref 3.5–5.1)
Sodium: 139 mmol/L (ref 135–145)

## 2016-01-24 LAB — TROPONIN I: Troponin I: <0.03 ng/mL (ref <0.03)

## 2016-01-24 LAB — CBC
HCT: 39.8 % (ref 35.0–47.0)
HEMOGLOBIN: 13.7 g/dL (ref 12.0–16.0)
MCH: 31.3 pg (ref 26.0–34.0)
MCHC: 34.3 g/dL (ref 32.0–36.0)
MCV: 91.4 fL (ref 80.0–100.0)
Platelets: 239 10*3/uL (ref 150–440)
RBC: 4.36 MIL/uL (ref 3.80–5.20)
RDW: 13.1 % (ref 11.5–14.5)
WBC: 8 10*3/uL (ref 3.6–11.0)

## 2016-01-24 MED ORDER — ALPRAZOLAM 0.5 MG PO TABS: 0.5000 mg | ORAL_TABLET | Freq: Three times a day (TID) | ORAL | 0 refills | Status: AC | PRN

## 2016-01-24 NOTE — Discharge Instructions (Signed)
You have been seen in the Emergency Department (ED) today for chest pain.  As we have discussed today?s test results are normal, and we feel it is likely that panic attacks may be causing your symptoms.  As we also discussed, we are providing me with a one time prescription for Xanax which hopefully will help with your symptoms, at least until your Prozac becomes effective.  However, you will not be provided another prescription for Xanax from a [facility] emergency department.  You must follow-up with a mental health professional, such as one at [facility] or one of the other resources provided to you during your visit, or discuss additional treatment with your primary care doctor.  Please follow up with the recommended doctor as instructed above in these documents regarding today?s emergent visit and your recent symptoms to discuss further management.  Continue to take your regular medications. If you are not doing so already, consider taking a daily baby aspirin (81 mg), at least until you follow up with your doctor.  Return to the Emergency Department (ED) if you experience any further chest pain/pressure/tightness, difficulty breathing, or sudden sweating, or other symptoms that concern you.

## 2016-01-24 NOTE — ED Triage Notes (Signed)
Pt placed in wheelchair due to SOB at triage. Pt reports she was at work when she started to develop chest pain, SOB and dizziness. Pt sts she has had episodes of the same intermittently for 2 months, seen by PCP, pt unsure of diagnosis other than she knows it has to do with palpitations. Pt is currently taking metoprolol for treatment. Pt has a hx of panic attacks.

## 2016-01-24 NOTE — ED Notes (Signed)
Pt reports having panic attacks this morning, reports has been having panic attacks for 2 months, states increased stressors in life. Pt reports was at work and felt heart racing and dizziness this morning. Pt reports is currently on Vistaril but states is not helping. Pt alert and oriented x 4, no increased work in breathing noted, skin warm and dry.

## 2016-01-24 NOTE — ED Notes (Signed)
Pt. Going home with friend 

## 2016-01-24 NOTE — ED Provider Notes (Signed)
[facility] Emergency Department Provider Note  ____________________________________________   First MD Initiated Contact with Patient 01/24/16 0400     (approximate)  I have reviewed the triage vital signs and the nursing notes.   HISTORY  Chief Complaint Chest Pain; Shortness of Breath; and Dizziness    HPI Alexandra Holt is a 40 y.o. female with a past medical history that includes narcotics dependence on daily Suboxone, anxiety, depression and migraines who presents for evaluation of episodic chest pain, shortness of breath, and anxiety that she attributes to panic attacks.  She reports that she has been having these episodes for several months.She has had multiple emergency department visits to this emergency department as well as to [facility] for the same symptoms.  She was started on metoprolol which she does not think has helped at all.  She states that her primary care doctor will not prescribe any benzodiazepines for her because of her narcotics addiction.  She thinks that that is what she needs and what would help the most.  She is currently asymptomatic but did have several episodes earlier today.  She states that it is affecting her work and that she is afraid she is going to give fired because of these episodes, which is increasing her anxiety.   Past Medical History:  Diagnosis Date  . Anxiety   . Asthma   . Depression   . H/O: drug dependency (HCC)   . Migraine   . Narcotic addiction (HCC)    takes Suboxone    Patient Active Problem List   Diagnosis Date Noted  . Weight gain 06/15/2013  . Bronchitis 06/12/2013  . GASTROENTERITIS 07/13/2009  . 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 CELLULITIS AND ABSCESS OF FACE 01/06/2008  . ARTHRITIS 01/06/2008  . NECK PAIN 06/09/2007  . DEPRESSION 01/01/2007  . DIZZINESS OR VERTIGO 01/01/2007  . HEADACHE 01/01/2007    Past Surgical History:  Procedure Laterality Date  . ECTOPIC  PREGNANCY SURGERY    . TOOTH EXTRACTION      Prior to Admission medications   Medication Sig Start Date End Date Taking? Authorizing Provider  albuterol (PROVENTIL HFA;VENTOLIN HFA) 108 (90 Base) MCG/ACT inhaler Inhale 2 puffs into the lungs every 6 (six) hours as needed for wheezing or shortness of breath. Patient not taking: Reported on 01/12/2016 07/22/15   Tommi Rumpshonda L Summers, PA-C  albuterol (PROVENTIL HFA;VENTOLIN HFA) 108 (90 Base) MCG/ACT inhaler Inhale 2 puffs into the lungs every 6 (six) hours as needed. Patient not taking: Reported on 01/12/2016 10/29/15   Rebecka ApleyAllison P Webster, MD  albuterol (PROVENTIL HFA;VENTOLIN HFA) 108 (90 Base) MCG/ACT inhaler Inhale 2 puffs into the lungs every 6 (six) hours as needed for wheezing or shortness of breath. Patient not taking: Reported on 01/12/2016 12/01/15   Darci Currentandolph N Brown, MD  albuterol (PROVENTIL) (2.5 MG/3ML) 0.083% nebulizer solution Take 3 mLs (2.5 mg total) by nebulization every 4 (four) hours as needed for wheezing or shortness of breath. Patient not taking: Reported on 01/12/2016 09/29/15   Dione Boozeavid Glick, MD  ALPRAZolam Prudy Feeler(XANAX) 0.5 MG tablet Take 1 tablet (0.5 mg total) by mouth 3 (three) times daily as needed for anxiety. 01/24/16 01/23/17  Loleta Roseory Herley Bernardini, MD  benzonatate (TESSALON PERLES) 100 MG capsule Take 1 capsule (100 mg total) by mouth every 6 (six) hours as needed for cough. Patient not taking: Reported on 01/12/2016 10/29/15   Rebecka ApleyAllison P Webster, MD  Buprenorphine HCl-Naloxone HCl (SUBOXONE) 8-2 MG FILM Place 1 Film  under the tongue 2 (two) times daily.    Historical Provider, MD  FLUoxetine (PROZAC) 40 MG capsule Take 40 mg by mouth at bedtime.     Historical Provider, MD  fluticasone (FLOVENT HFA) 110 MCG/ACT inhaler Inhale 1 puff into the lungs 2 (two) times daily. Patient taking differently: Inhale 1 puff into the lungs daily.  09/29/15   Dione Booze, MD  hydrOXYzine (VISTARIL) 100 MG capsule Take 100 mg by mouth 4 (four) times daily.     Historical Provider, MD  ibuprofen (ADVIL,MOTRIN) 200 MG tablet Take 800 mg by mouth every 6 (six) hours as needed for headache (pain).    Historical Provider, MD  ibuprofen (ADVIL,MOTRIN) 600 MG tablet Take 1 tablet (600 mg total) by mouth every 8 (eight) hours as needed. Patient not taking: Reported on 01/12/2016 06/22/15   Governor Rooks, MD  metoprolol tartrate (LOPRESSOR) 25 MG tablet Take 1 tablet (25 mg total) by mouth 2 (two) times daily. 12/30/15 12/29/16  Sharman Cheek, MD    Allergies Penicillins and Sulfonamide derivatives  Family History  Problem Relation Age of Onset  . Diabetes Other   . Hypertension Other   . Cancer Maternal Aunt     Social History Social History  Substance Use Topics  . Smoking status: Current Every Day Smoker    Packs/day: 1.00    Years: 20.00    Types: Cigarettes  . Smokeless tobacco: Never Used  . Alcohol use No    Review of Systems Constitutional: No fever/chills Eyes: No visual changes. ENT: No sore throat. Cardiovascular: +chest pain and palpitations Respiratory: +shortness of breath. Gastrointestinal: No abdominal pain.  No nausea, no vomiting.  No diarrhea.  No constipation. Genitourinary: Negative for dysuria. Musculoskeletal: Negative for back pain. Skin: Negative for rash. Neurological: Negative for headaches, focal weakness or numbness.  10-point ROS otherwise negative.  ____________________________________________   PHYSICAL EXAM:  VITAL SIGNS: ED Triage Vitals  Enc Vitals Group     BP 01/24/16 0254 136/60     Pulse Rate 01/24/16 0254 66     Resp 01/24/16 0254 15     Temp 01/24/16 0254 98 F (36.7 C)     Temp Source 01/24/16 0254 Oral     SpO2 01/24/16 0228 100 %     Weight 01/24/16 0228 175 lb (79.4 kg)     Height 01/24/16 0228 5\' 10"  (1.778 m)     Head Circumference --      Peak Flow --      Pain Score --      Pain Loc --      Pain Edu? --      Excl. in GC? --     Constitutional: Alert and oriented. Well  appearing and in no acute distress. Eyes: Conjunctivae are normal. PERRL. EOMI. Head: Atraumatic. Nose: No congestion/rhinnorhea. Mouth/Throat: Mucous membranes are moist.  Oropharynx non-erythematous. Neck: No stridor.  No meningeal signs.   Cardiovascular: Normal rate, regular rhythm. Good peripheral circulation. Grossly normal heart sounds. Respiratory: Normal respiratory effort.  No retractions. Lungs CTAB. Gastrointestinal: Soft and nontender. No distention.  Musculoskeletal: No lower extremity tenderness nor edema. No gross deformities of extremities. Neurologic:  Normal speech and language. No gross focal neurologic deficits are appreciated.  Skin:  Skin is warm, dry and intact. No rash noted. Psychiatric: Mood and affect are normal. Speech and behavior are normal.  ____________________________________________   LABS (all labs ordered are listed, but only abnormal results are displayed)  Labs Reviewed  BASIC METABOLIC PANEL  CBC  TROPONIN I  URINE DRUG SCREEN, QUALITATIVE ([facility] ONLY)   ____________________________________________  EKG  ED ECG REPORT I, Jarion Hawthorne, the attending physician, personally viewed and interpreted this ECG.  Date: 01/24/2016 EKG Time: 02:28 Rate: 68 Rhythm: normal sinus rhythm QRS Axis: normal Intervals: normal ST/T Wave abnormalities: normal Conduction Disturbances: none Narrative Interpretation: unremarkable  ____________________________________________  RADIOLOGY   Dg Chest 2 View  Result Date: 01/24/2016 CLINICAL DATA:  Shortness of breath, chest pain and dizziness. Similar intermittent symptoms for 2 months. History of asthma. EXAM: CHEST  2 VIEW COMPARISON:  Chest radiograph and CT chest January 12, 2016 FINDINGS: Cardiomediastinal silhouette is normal. No pleural effusions or focal consolidations. Mild increased lung volumes. Linear densities RIGHT mid and lower lung zones. Trachea projects midline and there is no  pneumothorax. Soft tissue planes and included osseous structures are non-suspicious. IMPRESSION: RIGHT atelectasis. Electronically Signed   By: Awilda Metro M.D.   On: 01/24/2016 03:54    ____________________________________________   PROCEDURES  Procedure(s) performed:   Procedures   Critical Care performed: No ____________________________________________   INITIAL IMPRESSION / ASSESSMENT AND PLAN / ED COURSE  Pertinent labs & imaging results that were available during my care of the patient were reviewed by me and considered in my medical decision making (see chart for details).  Patient has had multiple workups from a cardiac perspective.  The metoprolol does not seem to be helping her symptoms.  I understand the concern that she is on Suboxone but she is also suffering from what really does sound like panic attacks.  She has started back on Prozac but of course that takes a while to become efficacious.  I prescribed her 15 Xanax 0.5 mg tablets, but I made it very clear verbally and in written instructions that the ED would NOT be able to refill her prescriptions in the future.  I gave her outpatient resources for mental health via [facility], as well as giving her the contact information for [facility].  She understands that this is a one-time deal.    No indication of emergent medical condition at this time.  I gave my usual and customary return precautions.  ____________________________________________  FINAL CLINICAL IMPRESSION(S) / ED DIAGNOSES  Final diagnoses:  Panic attacks  Atypical chest pain  Anxiety     MEDICATIONS GIVEN DURING THIS VISIT:  Medications - No data to display   NEW OUTPATIENT MEDICATIONS STARTED DURING THIS VISIT:  New Prescriptions   ALPRAZOLAM (XANAX) 0.5 MG TABLET    Take 1 tablet (0.5 mg total) by mouth 3 (three) times daily as needed for anxiety.    Modified Medications   No medications on file    Discontinued Medications   No  medications on file     Note:  This document was prepared using Dragon voice recognition software and may include unintentional dictation errors.    Loleta Rose, MD 01/24/16 [phone]

## 2016-08-13 ENCOUNTER — Emergency Department (HOSPITAL_COMMUNITY)
Admission: EM | Admit: 2016-08-13 | Discharge: 2016-08-13 | Disposition: A | Discharge status: Home / Self Care | Payer: BLUE CROSS/BLUE SHIELD | Attending: Physician Assistant | Admitting: Physician Assistant

## 2016-08-13 ENCOUNTER — Encounter (HOSPITAL_COMMUNITY): Payer: Self-pay | Admitting: Emergency Medicine

## 2016-08-13 DIAGNOSIS — F410 Panic disorder [episodic paroxysmal anxiety] without agoraphobia: Secondary | ICD-10-CM

## 2016-08-13 DIAGNOSIS — J45909 Unspecified asthma, uncomplicated: Secondary | ICD-10-CM | POA: Insufficient documentation

## 2016-08-13 DIAGNOSIS — F419 Anxiety disorder, unspecified: Secondary | ICD-10-CM | POA: Insufficient documentation

## 2016-08-13 DIAGNOSIS — Z79899 Other long term (current) drug therapy: Secondary | ICD-10-CM | POA: Insufficient documentation

## 2016-08-13 DIAGNOSIS — F17210 Nicotine dependence, cigarettes, uncomplicated: Secondary | ICD-10-CM | POA: Insufficient documentation

## 2016-08-13 LAB — CBG MONITORING, ED: GLUCOSE-CAPILLARY: 107 mg/dL — AB (ref 65–99)

## 2016-08-13 NOTE — ED Notes (Signed)
Bed: [facility]4 Expected date:  Expected time:  Means of arrival:  Comments: 

## 2016-08-13 NOTE — ED Provider Notes (Signed)
[facility]EMER[facility]Y DEPT Provider Note   CSN: [id number] Arrival date & time: 08/13/16  1731     History   Chief Complaint Chief Complaint  Patient presents with  . Anxiety    HPI Alexandra Holt is a 41 y.o. female.  HPI   Patient called EMS because she was having a panic attack. Patient felt really anxious, the room go fuzzy, shortness of breath,. This is usual for her panic attack. She felt like her blood pressure was high she called EMS. Patient has a court date on Monday and she thinks it may be related.  Past Medical History:  Diagnosis Date  . Anxiety   . Asthma   . Depression   . H/O: drug dependency (HCC)   . Migraine   . Narcotic addiction (HCC)    takes Suboxone    Patient Active Problem List   Diagnosis Date Noted  . Weight gain 06/15/2013  . Bronchitis 06/12/2013  . GASTROENTERITIS 07/13/2009  . 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 CELLULITIS AND ABSCESS OF FACE 01/06/2008  . ARTHRITIS 01/06/2008  . NECK PAIN 06/09/2007  . DEPRESSION 01/01/2007  . DIZZINESS OR VERTIGO 01/01/2007  . HEADACHE 01/01/2007    Past Surgical History:  Procedure Laterality Date  . ECTOPIC PREGNANCY SURGERY    . TOOTH EXTRACTION      OB History    Gravida Para Term Preterm AB Living   SAB TAB Ectopic Multiple Live Births     4 2           Home Medications    Prior to Admission medications   Medication Sig Start Date End Date Taking? Authorizing Provider  albuterol (PROVENTIL HFA;VENTOLIN HFA) 108 (90 Base) MCG/ACT inhaler Inhale 2 puffs into the lungs every 6 (six) hours as needed for wheezing or shortness of breath. Patient not taking: Reported on 01/12/2016 07/22/15   Tommi Rumps, PA-C  albuterol (PROVENTIL HFA;VENTOLIN HFA) 108 (90 Base) MCG/ACT inhaler Inhale 2 puffs into the lungs every 6 (six) hours as needed. Patient not taking: Reported on 01/12/2016 10/29/15   Rebecka Apley, MD  albuterol (PROVENTIL HFA;VENTOLIN HFA) 108 (90  Base) MCG/ACT inhaler Inhale 2 puffs into the lungs every 6 (six) hours as needed for wheezing or shortness of breath. Patient not taking: Reported on 01/12/2016 12/01/15   Darci Current, MD  albuterol (PROVENTIL) (2.5 MG/3ML) 0.083% nebulizer solution Take 3 mLs (2.5 mg total) by nebulization every 4 (four) hours as needed for wheezing or shortness of breath. Patient not taking: Reported on 01/12/2016 09/29/15   Dione Booze, MD  ALPRAZolam Prudy Feeler) 0.5 MG tablet Take 1 tablet (0.5 mg total) by mouth 3 (three) times daily as needed for anxiety. 01/24/16 01/23/17  Loleta Rose, MD  benzonatate (TESSALON PERLES) 100 MG capsule Take 1 capsule (100 mg total) by mouth every 6 (six) hours as needed for cough. Patient not taking: Reported on 01/12/2016 10/29/15   Rebecka Apley, MD  Buprenorphine HCl-Naloxone HCl (SUBOXONE) 8-2 MG FILM Place 1 Film under the tongue 2 (two) times daily.    Historical Provider, MD  FLUoxetine (PROZAC) 40 MG capsule Take 40 mg by mouth at bedtime.     Historical Provider, MD  fluticasone (FLOVENT HFA) 110 MCG/ACT inhaler Inhale 1 puff into the lungs 2 (two) times daily. Patient taking differently: Inhale 1 puff into the lungs daily.  09/29/15   Dione Booze, MD  hydrOXYzine (VISTARIL) 100 MG  capsule Take 100 mg by mouth 4 (four) times daily.    Historical Provider, MD  ibuprofen (ADVIL,MOTRIN) 200 MG tablet Take 800 mg by mouth every 6 (six) hours as needed for headache (pain).    Historical Provider, MD  ibuprofen (ADVIL,MOTRIN) 600 MG tablet Take 1 tablet (600 mg total) by mouth every 8 (eight) hours as needed. Patient not taking: Reported on 01/12/2016 06/22/15   Governor Rooks, MD  metoprolol tartrate (LOPRESSOR) 25 MG tablet Take 1 tablet (25 mg total) by mouth 2 (two) times daily. 12/30/15 12/29/16  Sharman Cheek, MD    Family History Family History  Problem Relation Age of Onset  . Diabetes Other   . Hypertension Other   . Cancer Maternal Aunt     Social  History Social History  Substance Use Topics  . Smoking status: Current Every Day Smoker    Packs/day: 1.00    Years: 20.00    Types: Cigarettes  . Smokeless tobacco: Never Used  . Alcohol use No     Allergies   Penicillins and Sulfonamide derivatives   Review of Systems Review of Systems  Neurological: Positive for weakness, light-headedness and numbness.  Psychiatric/Behavioral: The patient is nervous/anxious.   All other systems reviewed and are negative.    Physical Exam Updated Vital Signs BP (!) 155/72 (BP Location: Left Arm)   Pulse 80   Temp 97.8 F (36.6 C) (Oral)   Resp 18   Ht 6' (1.829 m)   Wt 212 lb (96.2 kg)   SpO2 98%   BMI 28.75 kg/m   Physical Exam  Constitutional: She is oriented to person, place, and time. She appears well-developed and well-nourished.  HENT:  Head: Normocephalic and atraumatic.  Eyes: Right eye exhibits no discharge.  Cardiovascular: Normal rate.   No murmur heard. Pulmonary/Chest: Effort normal.  Abdominal: She exhibits no distension.  Neurological: She is oriented to person, place, and time.  Skin: Skin is warm and dry. She is not diaphoretic.  Psychiatric: She has a normal mood and affect.  Nursing note and vitals reviewed.    ED Treatments / Results  Labs (all labs ordered are listed, but only abnormal results are displayed) Labs Reviewed  CBG MONITORING, ED - Abnormal; Notable for the following:       Result Value   Glucose-Capillary 107 (*)    All other components within normal limits    EKG  EKG Interpretation None       Radiology No results found.  Procedures Procedures (including critical care time)  Medications Ordered in ED Medications - No data to display   Initial Impression / Assessment and Plan / ED Course  I have reviewed the triage vital signs and the nursing notes.  Pertinent labs & imaging results that were available during my care of the patient were reviewed by me and  considered in my medical decision making (see chart for details).     Patient's a 41 year old female with known anxiety disorder and history of anxiety attacks presenting today with anxiety attack. When I saw patient everything had resolved. She felt back to normal and would like to return home. We gave her a number for PCPs that accepts patients without insurance.  Final Clinical Impressions(s) / ED Diagnoses   Final diagnoses:  Anxiety attack    New Prescriptions Discharge Medication List as of 08/13/2016  7:01 PM       Bryna Razavi Lyn Estelita Iten, MD 08/13/16 1913

## 2016-08-13 NOTE — ED Notes (Signed)
GLUCOSE LEVEL 107

## 2016-08-13 NOTE — Discharge Instructions (Signed)
To find a primary care or specialty doctor please call [phone] or [phone] to access "[facility] Find a Doctor Service." ° °You may also go on the [facility] website at [email or web address] ° °There are also multiple [facility] practices throughout the [place] that are frequently accepting new patients. You may find a clinic that is close to your home and contact them. ° °[facility] -  °[address] °[place] [address] °[phone] ° °[facility] in [place] (also locations in [place] and [place]) -  °[address] °[place] [address] [phone] ° °[facility] -  °[address] °[place] [address] [phone] ° ° °

## 2016-08-13 NOTE — ED Triage Notes (Signed)
Patient reports she was sitting watching tv and all of a sudden felt really hot and the tv got blurry with tingling sensation in her feet. States she suffers from panic attacks. Immediately took her BP and it was elevated. Pt denies any symptoms now.

## 2016-08-26 DIAGNOSIS — J40 Bronchitis, not specified as acute or chronic: Secondary | ICD-10-CM | POA: Insufficient documentation

## 2016-08-26 DIAGNOSIS — F17210 Nicotine dependence, cigarettes, uncomplicated: Secondary | ICD-10-CM | POA: Insufficient documentation

## 2016-08-26 DIAGNOSIS — Z79899 Other long term (current) drug therapy: Secondary | ICD-10-CM | POA: Insufficient documentation

## 2016-08-27 ENCOUNTER — Emergency Department (HOSPITAL_COMMUNITY)
Admission: EM | Admit: 2016-08-27 | Discharge: 2016-08-27 | Disposition: A | Discharge status: Home / Self Care | Payer: Self-pay | Attending: Emergency Medicine | Admitting: Emergency Medicine

## 2016-08-27 ENCOUNTER — Encounter (HOSPITAL_COMMUNITY): Payer: Self-pay

## 2016-08-27 ENCOUNTER — Emergency Department (HOSPITAL_COMMUNITY): Payer: Self-pay

## 2016-08-27 DIAGNOSIS — J40 Bronchitis, not specified as acute or chronic: Secondary | ICD-10-CM

## 2016-08-27 DIAGNOSIS — R05 Cough: Secondary | ICD-10-CM

## 2016-08-27 DIAGNOSIS — R059 Cough, unspecified: Secondary | ICD-10-CM

## 2016-08-27 MED ORDER — ALBUTEROL SULFATE (2.5 MG/3ML) 0.083% IN NEBU: 2.5000 mg | INHALATION_SOLUTION | RESPIRATORY_TRACT | 0 refills | Status: DC | PRN

## 2016-08-27 MED ORDER — ALBUTEROL SULFATE HFA 108 (90 BASE) MCG/ACT IN AERS
2.0000 | INHALATION_SPRAY | RESPIRATORY_TRACT | Status: DC | PRN
  Filled 2016-08-27: qty 6.7

## 2016-08-27 MED ORDER — ALBUTEROL SULFATE (2.5 MG/3ML) 0.083% IN NEBU
5.0000 mg | INHALATION_SOLUTION | Freq: Once | RESPIRATORY_TRACT | Status: AC
  Administered 2016-08-27: 5 mg via RESPIRATORY_TRACT
  Filled 2016-08-27: qty 6

## 2016-08-27 MED ORDER — AEROCHAMBER PLUS W/MASK MISC
1.0000 | Freq: Once | Status: AC
  Administered 2016-08-27: 1
  Filled 2016-08-27: qty 1

## 2016-08-27 MED ORDER — BENZONATATE 100 MG PO CAPS
200.0000 mg | ORAL_CAPSULE | Freq: Once | ORAL | Status: AC
  Administered 2016-08-27: 200 mg via ORAL
  Filled 2016-08-27: qty 2

## 2016-08-27 MED ORDER — AZITHROMYCIN 250 MG PO TABS
250.0000 mg | ORAL_TABLET | Freq: Every day | ORAL | 0 refills | Status: DC
Start: 2016-08-27 — End: 2017-01-16

## 2016-08-27 MED ORDER — BENZONATATE 100 MG PO CAPS: 100.0000 mg | ORAL_CAPSULE | Freq: Three times a day (TID) | ORAL | 0 refills | Status: DC

## 2016-08-27 MED ORDER — AZITHROMYCIN 250 MG PO TABS
500.0000 mg | ORAL_TABLET | Freq: Once | ORAL | Status: AC
  Administered 2016-08-27: 500 mg via ORAL
  Filled 2016-08-27: qty 2

## 2016-08-27 NOTE — ED Provider Notes (Signed)
[facility]PT Provider Note   CSN: [id number] Arrival date & time: 08/26/16  2332  By signing my name below, I, Majel Homer, attest that this documentation has been prepared under the direction and in the presence of [facility], PA-C, . Electronically Signed: Majel Homer, Scribe. 08/27/2016. 12:51 AM.  History   Chief Complaint Chief Complaint  Patient presents with  . Cough  . Shortness of Breath   The history is provided by the patient. No language interpreter was used.   HPI Comments: Alexandra Holt is a 41 y.o. female with PMHx of pneumonia, bronchitis, asthma, and drug dependency, who presents to the Emergency Department complaining of gradually worsening, dry cough and chest congestion that began ~2 weeks ago and worsened within the past week. Pt reports associated rib pain secondary to her cough, shortness of breath, sore throat, bilateral ear pain, and subjective fever that has now resolved. She states she has taken Mucinex with no relief of her pain. She denies any chills or other complaints. Pt admits to smoking ~0.5 packs of cigarettes every day.   Past Medical History:  Diagnosis Date  . Anxiety   . Asthma   . Depression   . H/O: drug dependency (HCC)   . Migraine   . Narcotic addiction (HCC)    takes Suboxone    Patient Active Problem List   Diagnosis Date Noted  . Weight gain 06/15/2013  . Bronchitis 06/12/2013  . GASTROENTERITIS 07/13/2009  . 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 CELLULITIS AND ABSCESS OF FACE 01/06/2008  . ARTHRITIS 01/06/2008  . NECK PAIN 06/09/2007  . DEPRESSION 01/01/2007  . DIZZINESS OR VERTIGO 01/01/2007  . HEADACHE 01/01/2007    Past Surgical History:  Procedure Laterality Date  . ECTOPIC PREGNANCY SURGERY    . TOOTH EXTRACTION      OB History    Gravida Para Term Preterm AB Living   7 1 1   6 1    SAB TAB Ectopic Multiple Live Births     4 2         Home Medications    Prior to Admission  medications   Medication Sig Start Date End Date Taking? Authorizing Provider  albuterol (PROVENTIL) (2.5 MG/3ML) 0.083% nebulizer solution Take 3 mLs (2.5 mg total) by nebulization every 4 (four) hours as needed for wheezing or shortness of breath. 08/27/16   Herschel Fleagle, Dahlia Client, PA-C  ALPRAZolam Prudy Feeler) 0.5 MG tablet Take 1 tablet (0.5 mg total) by mouth 3 (three) times daily as needed for anxiety. 01/24/16 01/23/17  Loleta Rose, MD  azithromycin (ZITHROMAX) 250 MG tablet Take 1 tablet (250 mg total) by mouth daily. Take first 2 tablets together, then 1 every day until finished. 08/27/16   Aydee Mcnew, Dahlia Client, PA-C  benzonatate (TESSALON) 100 MG capsule Take 1 capsule (100 mg total) by mouth every 8 (eight) hours. 08/27/16   Latoya Diskin, Dahlia Client, PA-C  Buprenorphine HCl-Naloxone HCl (SUBOXONE) 8-2 MG FILM Place 1 Film under the tongue 2 (two) times daily.    [provider]  FLUoxetine (PROZAC) 40 MG capsule Take 40 mg by mouth at bedtime.     [provider]  fluticasone (FLOVENT HFA) 110 MCG/ACT inhaler Inhale 1 puff into the lungs 2 (two) times daily. Patient taking differently: Inhale 1 puff into the lungs daily.  09/29/15   Dione Booze, MD  hydrOXYzine (VISTARIL) 100 MG capsule Take 100 mg by mouth 4 (four) times daily.    [provider]  metoprolol tartrate (LOPRESSOR) 25  MG tablet Take 1 tablet (25 mg total) by mouth 2 (two) times daily. 12/30/15 12/29/16  Sharman CheekStafford, Phillip, MD    Family History Family History  Problem Relation Age of Onset  . Diabetes Other   . Hypertension Other   . Cancer Maternal Aunt     Social History Social History  Substance Use Topics  . Smoking status: Current Every Day Smoker    Packs/day: 1.00    Years: 20.00    Types: Cigarettes  . Smokeless tobacco: Never Used  . Alcohol use No   Allergies   Penicillins and Sulfonamide derivatives  Review of Systems Review of Systems  Constitutional: Positive for fever (resolved).  Negative for chills.  HENT: Positive for congestion, ear pain and sore throat.   Respiratory: Positive for cough.   Musculoskeletal: Positive for arthralgias.  All other systems reviewed and are negative.  Physical Exam Updated Vital Signs BP 134/86 (BP Location: Left Arm)   Pulse 72   Temp 98.2 F (36.8 C) (Oral)   Resp 18   Ht 6' (1.829 m)   Wt 200 lb (90.7 kg)   SpO2 100%   BMI 27.12 kg/m   Physical Exam  Constitutional: She is oriented to person, place, and time. She appears well-developed and well-nourished. No distress.  HENT:  Head: Normocephalic and atraumatic.  Right Ear: Tympanic membrane, external ear and ear canal normal.  Left Ear: Tympanic membrane, external ear and ear canal normal.  Nose: Mucosal edema and rhinorrhea present. No epistaxis. Right sinus exhibits no maxillary sinus tenderness and no frontal sinus tenderness. Left sinus exhibits no maxillary sinus tenderness and no frontal sinus tenderness.  Mouth/Throat: Uvula is midline and mucous membranes are normal. Mucous membranes are not pale and not cyanotic. No oropharyngeal exudate, posterior oropharyngeal edema, posterior oropharyngeal erythema or tonsillar abscesses.  Eyes: Conjunctivae and EOM are normal. Pupils are equal, round, and reactive to light.  Neck: Normal range of motion and full passive range of motion without pain.  Cardiovascular: Normal rate, regular rhythm, normal heart sounds and intact distal pulses.   Pulmonary/Chest: Effort normal. No stridor. Tachypnea noted. She has no decreased breath sounds. She has no wheezes. She has rhonchi (mild, throughout ).  No focal breath sounds  Abdominal: Soft. She exhibits no distension. There is no tenderness.  Musculoskeletal: Normal range of motion.  Lymphadenopathy:    She has no cervical adenopathy.  Neurological: She is alert and oriented to person, place, and time.  Skin: Skin is warm and dry. No rash noted. She is not diaphoretic.    Psychiatric: She has a normal mood and affect. Judgment normal.  Nursing note and vitals reviewed.  ED Treatments / Results  DIAGNOSTIC STUDIES:  Oxygen Saturation is 100% on RA, normal by my interpretation.    COORDINATION OF CARE:  12:42 AM Discussed treatment plan with pt at bedside and pt agreed to plan.  Radiology Dg Chest 2 View  Result Date: 08/27/2016 CLINICAL DATA:  Cough, congestion, and fever for 72 hours. Shortness of breath. EXAM: CHEST  2 VIEW COMPARISON:  01/24/2016 FINDINGS: The heart size and mediastinal contours are within normal limits. Both lungs are clear. The visualized skeletal structures are unremarkable. IMPRESSION: No active cardiopulmonary disease. Electronically Signed   By: Burman NievesWilliam  Stevens M.D.   On: 08/27/2016 01:37    Procedures Procedures (including critical care time)  Medications Ordered in ED Medications  albuterol (PROVENTIL HFA;VENTOLIN HFA) 108 (90 Base) MCG/ACT inhaler 2 puff (not administered)  aerochamber plus  with mask device 1 each (not administered)  benzonatate (TESSALON) capsule 200 mg (not administered)  albuterol (PROVENTIL) (2.5 MG/3ML) 0.083% nebulizer solution 5 mg (5 mg Nebulization Given 08/27/16 0029)   Initial Impression / Assessment and Plan / ED Course  I have reviewed the triage vital signs and the nursing notes.  Pertinent labs & imaging results that were available during my care of the patient were reviewed by me and considered in my medical decision making (see chart for details).     Pt CXR negative for acute infiltrate. Patients symptoms are consistent with URI, likely viral etiology, however with ssx present for > 2 weeks with give abx for bronchitis. Pt will be discharged with symptomatic treatment.  Verbalizes understanding and is agreeable with plan. Pt is hemodynamically stable & in NAD prior to dc.  She is to f/u with her PCP in 48 hours.   I personally performed the services described in this documentation,  which was scribed in my presence. The recorded information has been reviewed and is accurate.   Final Clinical Impressions(s) / ED Diagnoses   Final diagnoses:  Bronchitis  Cough    New Prescriptions New Prescriptions   AZITHROMYCIN (ZITHROMAX) 250 MG TABLET    Take 1 tablet (250 mg total) by mouth daily. Take first 2 tablets together, then 1 every day until finished.   BENZONATATE (TESSALON) 100 MG CAPSULE    Take 1 capsule (100 mg total) by mouth every 8 (eight) hours.     Cristianna Cyr, Boyd Kerbs 08/27/16 [place]    Pricilla Loveless, MD 08/28/16 1410

## 2016-08-27 NOTE — ED Notes (Signed)
Pt c/o coughing with shortness of breath for past 2 weeks. Pt has unproductive cough, ribs are starting to hurt. Pt currently is a smoker.

## 2016-08-27 NOTE — Discharge Instructions (Signed)
1. Medications: albuterol, azithromycin, tessalon, usual home medications 2. Treatment: rest, drink plenty of fluids, take tylenol or ibuprofen for fever control 3. Follow Up: Please followup with your primary doctor in 3 days for discussion of your diagnoses and further evaluation after today's visit; if you do not have a primary care doctor use the resource guide provided to find one; Return to the ER for high fevers, difficulty breathing or other concerning symptoms

## 2016-09-17 ENCOUNTER — Encounter (HOSPITAL_COMMUNITY): Payer: Self-pay

## 2016-09-17 DIAGNOSIS — F17210 Nicotine dependence, cigarettes, uncomplicated: Secondary | ICD-10-CM | POA: Insufficient documentation

## 2016-09-17 DIAGNOSIS — Z79899 Other long term (current) drug therapy: Secondary | ICD-10-CM | POA: Insufficient documentation

## 2016-09-17 DIAGNOSIS — J45909 Unspecified asthma, uncomplicated: Secondary | ICD-10-CM | POA: Insufficient documentation

## 2016-09-17 DIAGNOSIS — M25562 Pain in left knee: Secondary | ICD-10-CM | POA: Insufficient documentation

## 2016-09-17 NOTE — ED Triage Notes (Signed)
Left leg pain for one month and getting worse states had to ambulate and bruising on left hip thigh area alert and oriented x 3 no respiratory or acute distress noted.

## 2016-09-18 ENCOUNTER — Emergency Department (HOSPITAL_COMMUNITY)
Admission: EM | Admit: 2016-09-18 | Discharge: 2016-09-18 | Disposition: A | Discharge status: Home / Self Care | Payer: BLUE CROSS/BLUE SHIELD | Attending: Emergency Medicine | Admitting: Emergency Medicine

## 2016-09-18 DIAGNOSIS — M25562 Pain in left knee: Secondary | ICD-10-CM

## 2016-09-18 MED ORDER — NAPROXEN 500 MG PO TABS: 500.0000 mg | ORAL_TABLET | Freq: Two times a day (BID) | ORAL | 0 refills | Status: DC

## 2016-09-18 MED ORDER — NAPROXEN 500 MG PO TABS
500.0000 mg | ORAL_TABLET | Freq: Once | ORAL | Status: AC
  Administered 2016-09-18: 500 mg via ORAL
  Filled 2016-09-18: qty 1

## 2016-09-18 NOTE — ED Provider Notes (Signed)
[facility] DEPT Provider Note   CSN: [id number] Arrival date & time: 09/17/16  2047  By signing my name below, I, Alexandra Holt, attest that this documentation has been prepared under the direction and in the presence of Elpidio Anis, PA-C. Electronically Signed: Karren Cobble, ED Scribe. 09/18/16. 2:23 AM.  History   Chief Complaint Chief Complaint  Patient presents with  . Leg Pain   The history is provided by the patient. No language interpreter was used.    HPI Comments: Alexandra Holt is a 41 y.o. female with a PMHx of arthritis, who presents to the Emergency Department complaining of gradually worsening, persistent throbbing, aching left knee pain that began one month ago. She notes associated pain with ambulation. She reports she has had this knee pain for one month and it continues to worsen. Notes her pain is worsened with ambulation and after exteneded period of sitting she notes difficulty ambulating. 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 She has tried Advil, knee immobilization brace, and heat with no relief. No recent traumatic injuries. No hx of traumatic injuries to the left knee. Reports previously in the past she had her left knee drained and had to receive a Cortizone shot as well. Notes smoking one pack of cigarettes a day. Occasional EtOH usage. Denies abdominal pain, nausea, vomiting, hematuria, melena, hematochezia, fever, chills, or sweats.    Past Medical History:  Diagnosis Date  . Anxiety   . Asthma   . Depression   . H/O: drug dependency (HCC)   . Migraine   . Narcotic addiction (HCC)    takes Suboxone    Patient Active Problem List   Diagnosis Date Noted  . Weight gain 06/15/2013  . Bronchitis 06/12/2013  . GASTROENTERITIS 07/13/2009  . 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 CELLULITIS AND ABSCESS OF FACE 01/06/2008  . ARTHRITIS 01/06/2008  . NECK PAIN 06/09/2007  . DEPRESSION 01/01/2007  . DIZZINESS OR VERTIGO  01/01/2007  . HEADACHE 01/01/2007    Past Surgical History:  Procedure Laterality Date  . ECTOPIC PREGNANCY SURGERY    . TOOTH EXTRACTION      OB History    Gravida Para Term Preterm AB Living   7 1 1   6 1    SAB TAB Ectopic Multiple Live Births     4 2           Home Medications    Prior to Admission medications   Medication Sig Start Date End Date Taking? Authorizing Provider  albuterol (PROVENTIL) (2.5 MG/3ML) 0.083% nebulizer solution Take 3 mLs (2.5 mg total) by nebulization every 4 (four) hours as needed for wheezing or shortness of breath. 08/27/16   Muthersbaugh, Dahlia Client, PA-C  ALPRAZolam Prudy Feeler) 0.5 MG tablet Take 1 tablet (0.5 mg total) by mouth 3 (three) times daily as needed for anxiety. 01/24/16 01/23/17  Loleta Rose, MD  azithromycin (ZITHROMAX) 250 MG tablet Take 1 tablet (250 mg total) by mouth daily. Take first 2 tablets together, then 1 every day until finished. 08/27/16   Muthersbaugh, Dahlia Client, PA-C  benzonatate (TESSALON) 100 MG capsule Take 1 capsule (100 mg total) by mouth every 8 (eight) hours. 08/27/16   Muthersbaugh, Dahlia Client, PA-C  Buprenorphine HCl-Naloxone HCl (SUBOXONE) 8-2 MG FILM Place 1 Film under the tongue 2 (two) times daily.    [provider]  FLUoxetine (PROZAC) 40 MG capsule Take 40 mg by mouth at bedtime.     [provider]  fluticasone (  FLOVENT HFA) 110 MCG/ACT inhaler Inhale 1 puff into the lungs 2 (two) times daily. Patient taking differently: Inhale 1 puff into the lungs daily.  09/29/15   Dione Booze, MD  hydrOXYzine (VISTARIL) 100 MG capsule Take 100 mg by mouth 4 (four) times daily.    [provider]  metoprolol tartrate (LOPRESSOR) 25 MG tablet Take 1 tablet (25 mg total) by mouth 2 (two) times daily. 12/30/15 12/29/16  Sharman Cheek, MD    Family History Family History  Problem Relation Age of Onset  . Diabetes Other   . Hypertension Other   . Cancer Maternal Aunt     Social History Social History    Substance Use Topics  . Smoking status: Current Every Day Smoker    Packs/day: 1.00    Years: 20.00    Types: Cigarettes  . Smokeless tobacco: Never Used  . Alcohol use No     Allergies   Penicillins and Sulfonamide derivatives   Review of Systems Review of Systems  Constitutional: Negative for chills and fever.  Gastrointestinal: Negative for abdominal pain, nausea and vomiting.  Musculoskeletal:       See HPI.  Skin: Negative.  Negative for color change and rash.  Neurological: Negative for syncope and weakness.   Physical Exam Updated Vital Signs BP 137/71 (BP Location: Left Arm)   Pulse 82   Temp 97.8 F (36.6 C) (Oral)   Resp 18   SpO2 100%   Physical Exam  Constitutional: She is oriented to person, place, and time. She appears well-developed and well-nourished. No distress.  HENT:  Head: Normocephalic and atraumatic.  Eyes: EOM are normal.  Neck: Normal range of motion.  Cardiovascular: Normal rate, regular rhythm, normal heart sounds and intact distal pulses.   Pulmonary/Chest: Effort normal and breath sounds normal.  Abdominal: Soft. She exhibits no distension. There is no tenderness.  Musculoskeletal: Normal range of motion.  Minimum swelling to the lateral aspect of the left knee.  Neg ballotting. No calor or erythema. Full ROM. Good strength. Neg Lachman. No tenderness to palpation to the anterior aspect of the left knee. Mild tenderness of the posterior left knee to the popliteal fossa. No mass noted. No calf or thigh tenderness or swelling.  Neurological: She is alert and oriented to person, place, and time.  Skin: Skin is warm and dry.  Psychiatric: She has a normal mood and affect. Judgment normal.  Nursing note and vitals reviewed.  ED Treatments / Results  DIAGNOSTIC STUDIES: Oxygen Saturation is 100% on RA, normal by my interpretation.   COORDINATION OF CARE: 1:57 AM-Discussed next steps with pt. Pt verbalized understanding and is agreeable  with the plan.   Labs (all labs ordered are listed, but only abnormal results are displayed) Labs Reviewed - No data to display  EKG  EKG Interpretation None       Radiology No results found.  Procedures Procedures (including critical care time)  Medications Ordered in ED Medications - No data to display   Initial Impression / Assessment and Plan / ED Course  I have reviewed the triage vital signs and the nursing notes.  Pertinent labs & imaging results that were available during my care of the patient were reviewed by me and considered in my medical decision making (see chart for details).     Patient with one month history of left posterior knee pain without significant swelling, redness. Symptomatic treatment without relief. Worse over time.   Exam is essentially benign. No evidence  septic joint, significant swelling, joint laxity. Doubt DVT based on presentation and lack of calf/thigh tenderness.   Will treat symptomatically. REfer to ortho for re-evaluation.  Final Clinical Impressions(s) / ED Diagnoses   Final diagnoses:  None   1. Left knee pain  New Prescriptions New Prescriptions   No medications on file  I personally performed the services described in this documentation, which was scribed in my presence. The recorded information has been reviewed and is accurate.      Elpidio AnisUpstill, Stefan Markarian, PA-C 09/18/16 0232    Ward, Layla MawKristen N, DO 09/18/16 [phone]

## 2017-01-05 IMAGING — CT CT ANGIO CHEST
2 of 6 series · 18 of 36 positions shown · IV contrast (Omni 300)
Comparison: Chest radiographs dated 01/12/2016

CLINICAL DATA: Upper chest pain, shortness of breath, palpitations

EXAM:
CT ANGIOGRAPHY CHEST WITH CONTRAST
TECHNIQUE: Multidetector CT imaging of the chest was performed using the
standard protocol during bolus administration of intravenous
contrast. Multiplanar CT image reconstructions and MIPs were
obtained to evaluate the vascular anatomy.
CONTRAST:  70 mL Isovue 370 IV

[Series 6: pe thins · axial · 0.65mm/px · z∈[-258,+4]mm · 17 of 296 slices shown]
[im 17/296  lung]
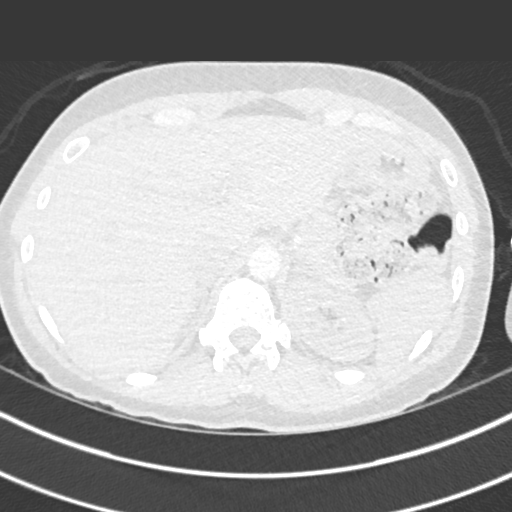
[im 33/296  mediastinal]
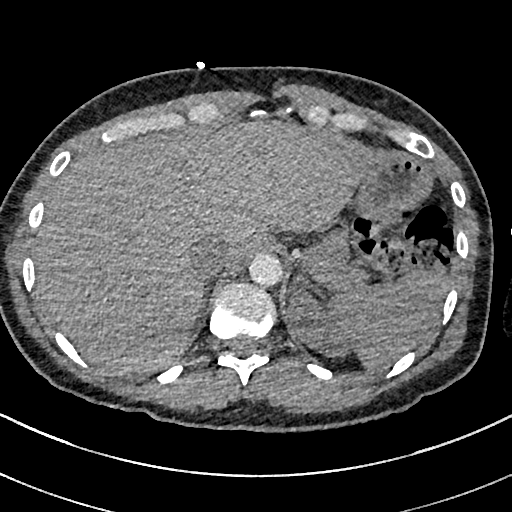
[im 50/296  lung]
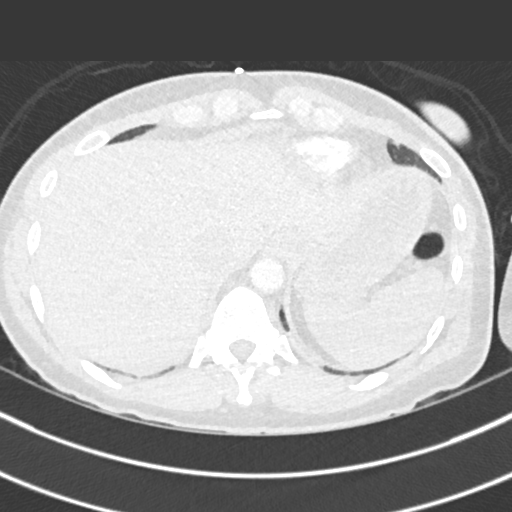
[im 66/296  mediastinal]
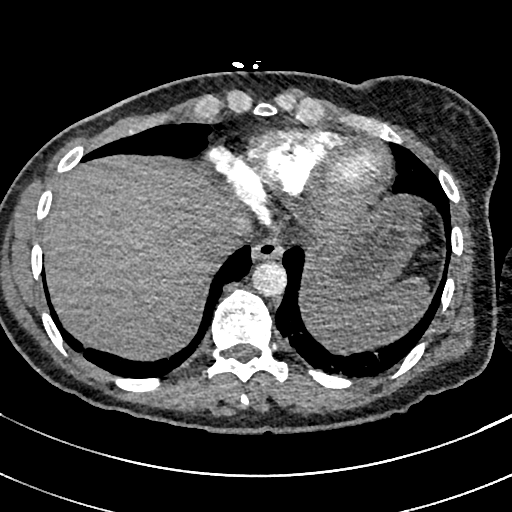
[im 82/296  lung]
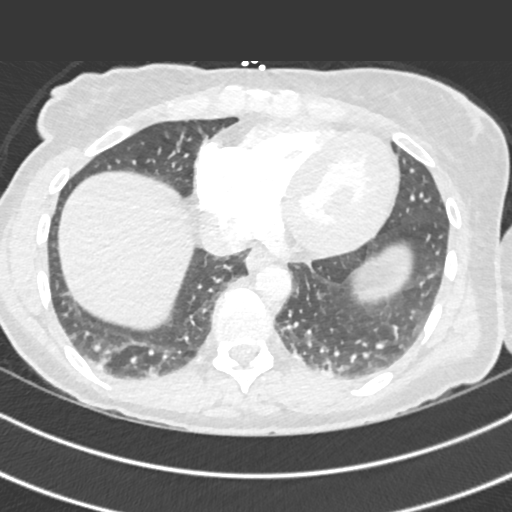
[im 99/296  mediastinal]
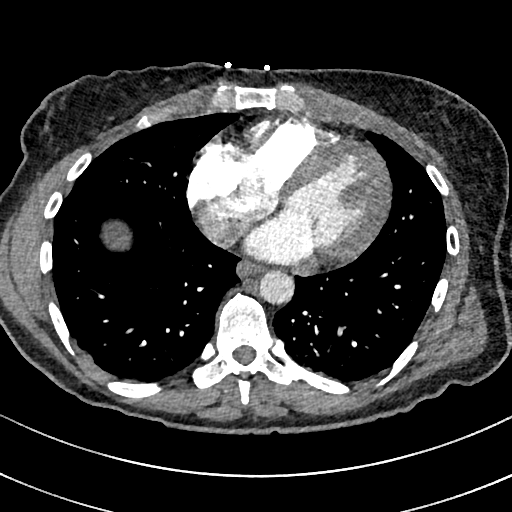
[im 115/296  lung]
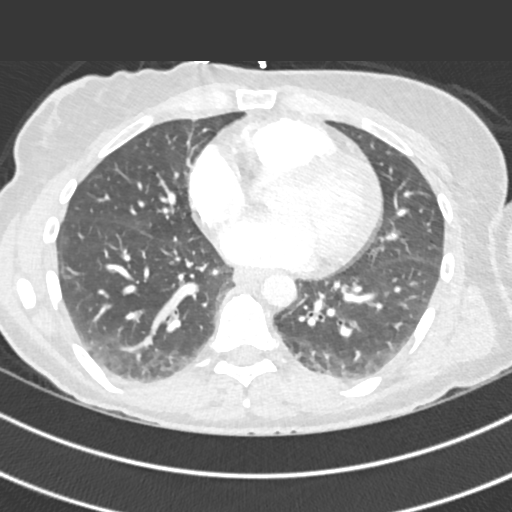
[im 132/296  mediastinal]
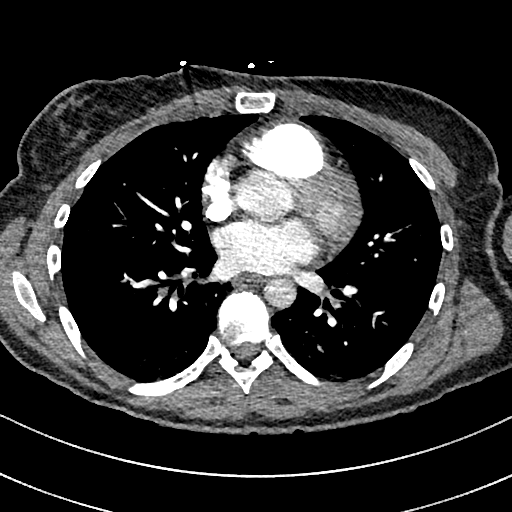
[im 148/296  lung]
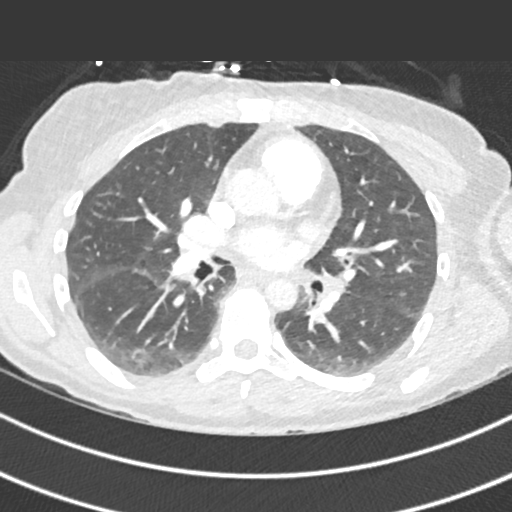
[im 164/296  mediastinal]
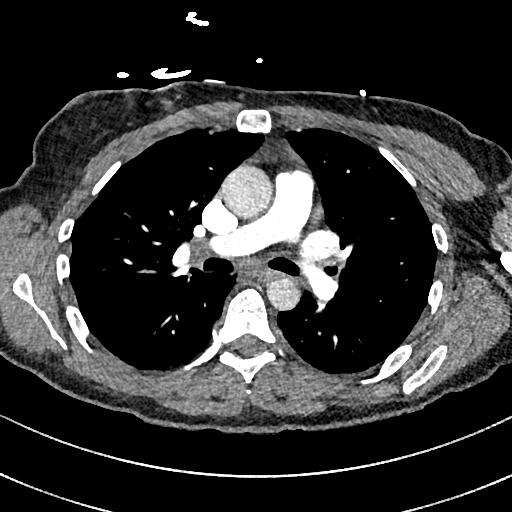
[im 181/296  lung]
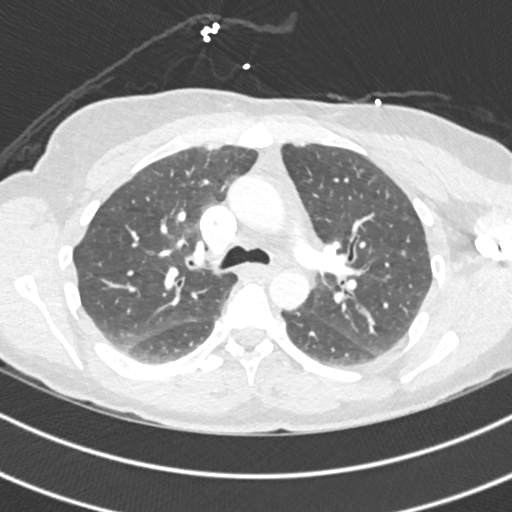
[im 197/296  mediastinal]
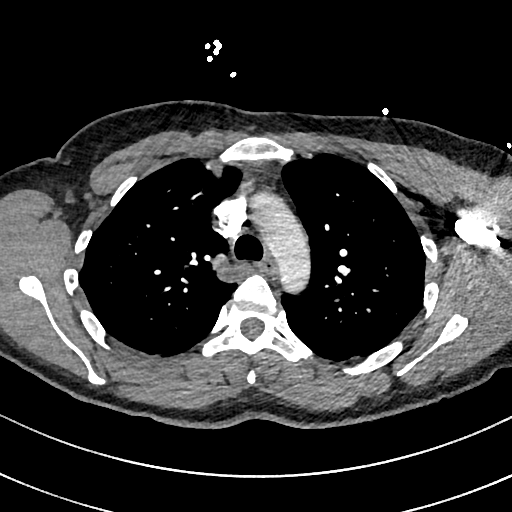
[im 214/296  lung]
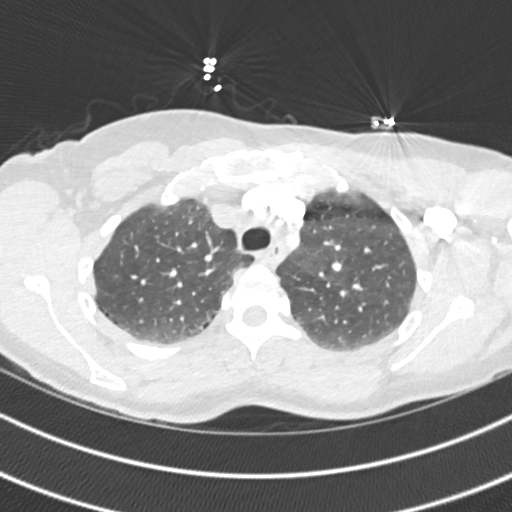
[im 230/296  mediastinal]
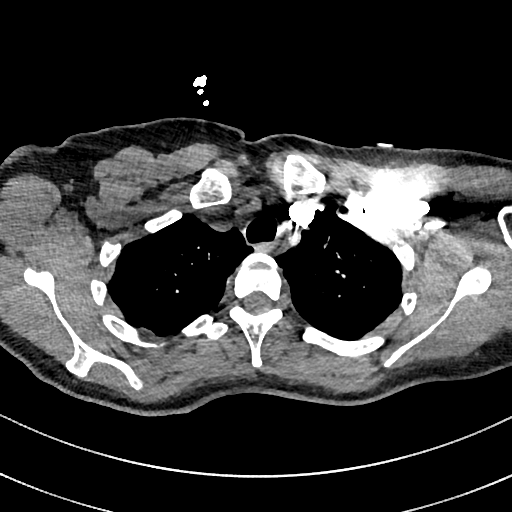
[im 246/296  lung]
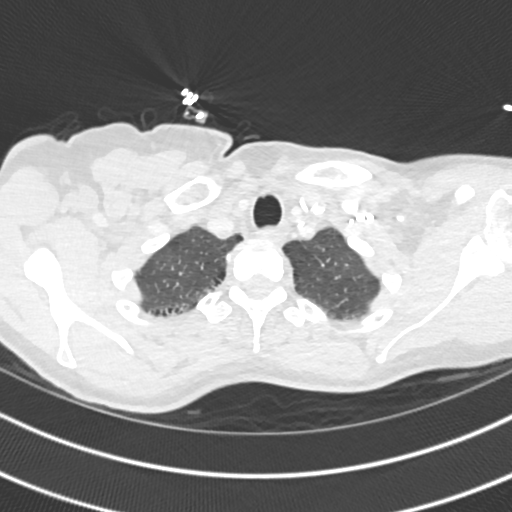
[im 263/296  mediastinal]
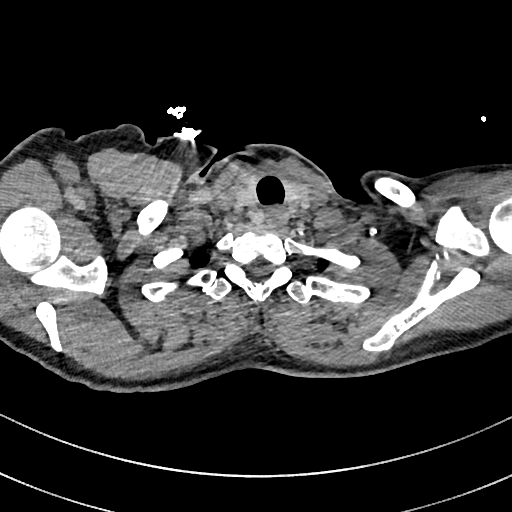
[im 279/296  lung]
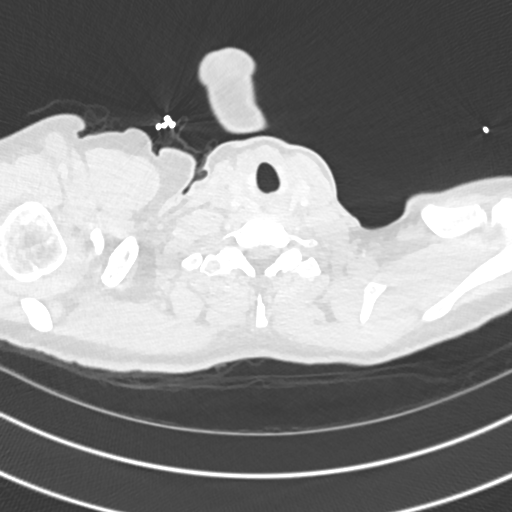

[Series 7: pe 2mm cor · coronal · 0.58mm/px · 1 of 150 slices shown]
[im 75/150  mediastinal]
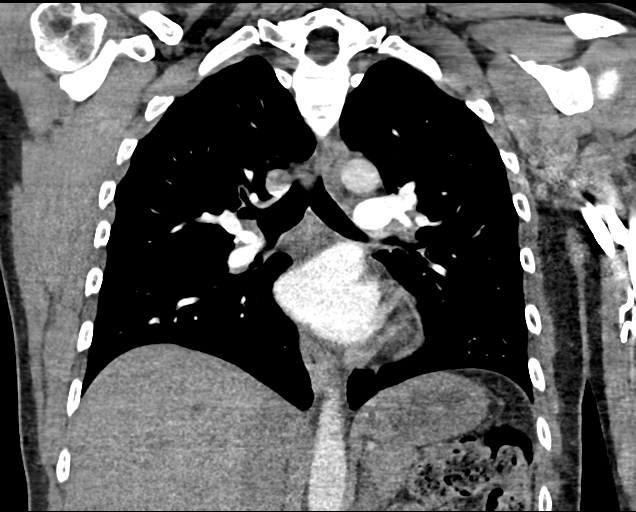

[18 of 36 positions shown; findings below may reference images not displayed]

FINDINGS: Cardiovascular: Satisfactory opacification of the pulmonary arteries
to the lobar level. No evidence of pulmonary embolism.

Although not tailored for evaluation of the thoracic aorta, there is
no evidence of thoracic aortic aneurysm or dissection.

The heart is normal in size.  No pericardial effusion.

Mediastinum/Nodes: No suspicious mediastinal, hilar, or axillary
lymphadenopathy.

Visualized thyroid is unremarkable.

Lungs/Pleura: Visualized lungs are essentially clear.

No suspicious pulmonary nodules.

Mild dependent atelectasis in the bilateral lower lobes.

Mild paraseptal emphysematous changes, upper lobe predominant.

No focal consolidation.

No pleural effusion or pneumothorax.

Upper Abdomen: Visualized upper abdomen is within normal limits.

Musculoskeletal: Mild degenerative changes of the visualized
thoracolumbar spine.

Review of the MIP images confirms the above findings.
IMPRESSION: No evidence of pulmonary embolism.

No evidence of acute cardiopulmonary disease.

## 2017-01-15 ENCOUNTER — Emergency Department (HOSPITAL_COMMUNITY): Payer: BLUE CROSS/BLUE SHIELD

## 2017-01-15 ENCOUNTER — Encounter (HOSPITAL_COMMUNITY): Payer: Self-pay | Admitting: Emergency Medicine

## 2017-01-15 DIAGNOSIS — Z79899 Other long term (current) drug therapy: Secondary | ICD-10-CM | POA: Diagnosis not present

## 2017-01-15 DIAGNOSIS — F17210 Nicotine dependence, cigarettes, uncomplicated: Secondary | ICD-10-CM | POA: Diagnosis not present

## 2017-01-15 DIAGNOSIS — J40 Bronchitis, not specified as acute or chronic: Secondary | ICD-10-CM | POA: Insufficient documentation

## 2017-01-15 DIAGNOSIS — R05 Cough: Secondary | ICD-10-CM | POA: Diagnosis present

## 2017-01-15 LAB — BASIC METABOLIC PANEL
Anion gap: 8 (ref 5–15)
BUN: 8 mg/dL (ref 6–20)
CALCIUM: 9 mg/dL (ref 8.9–10.3)
CHLORIDE: 105 mmol/L (ref 101–111)
CO2: 24 mmol/L (ref 22–32)
CREATININE: 0.72 mg/dL (ref 0.44–1.00)
Glucose, Bld: 79 mg/dL (ref 65–99)
Potassium: 4.1 mmol/L (ref 3.5–5.1)
SODIUM: 137 mmol/L (ref 135–145)

## 2017-01-15 LAB — CBC
HCT: 41 % (ref 36.0–46.0)
Hemoglobin: 13.2 g/dL (ref 12.0–15.0)
MCH: 30.6 pg (ref 26.0–34.0)
MCHC: 32.2 g/dL (ref 30.0–36.0)
MCV: 94.9 fL (ref 78.0–100.0)
PLATELETS: 229 10*3/uL (ref 150–400)
RBC: 4.32 MIL/uL (ref 3.87–5.11)
RDW: 13.5 % (ref 11.5–15.5)
WBC: 7.6 10*3/uL (ref 4.0–10.5)

## 2017-01-15 LAB — I-STAT TROPONIN, ED: TROPONIN I, POC: 0 ng/mL (ref 0.00–0.08)

## 2017-01-15 LAB — I-STAT BETA HCG BLOOD, ED (MC, WL, AP ONLY)

## 2017-01-15 NOTE — ED Triage Notes (Signed)
Patient reports bilateral lower ribcage pain radiating to mid back with SOB and dry cough onset last week , denies fever or emesis .

## 2017-01-16 ENCOUNTER — Emergency Department (HOSPITAL_COMMUNITY)
Admission: EM | Admit: 2017-01-16 | Discharge: 2017-01-16 | Disposition: A | Discharge status: Home / Self Care | Payer: BLUE CROSS/BLUE SHIELD | Attending: Emergency Medicine | Admitting: Emergency Medicine

## 2017-01-16 DIAGNOSIS — J40 Bronchitis, not specified as acute or chronic: Secondary | ICD-10-CM

## 2017-01-16 MED ORDER — IPRATROPIUM-ALBUTEROL 0.5-2.5 (3) MG/3ML IN SOLN
3.0000 mL | Freq: Once | RESPIRATORY_TRACT | Status: AC
  Administered 2017-01-16: 3 mL via RESPIRATORY_TRACT
  Filled 2017-01-16: qty 3

## 2017-01-16 MED ORDER — HYDROCODONE-HOMATROPINE 5-1.5 MG/5ML PO SYRP: 5.0000 mL | ORAL_SOLUTION | Freq: Four times a day (QID) | ORAL | 0 refills | Status: DC | PRN

## 2017-01-16 MED ORDER — PREDNISONE 20 MG PO TABS
60.0000 mg | ORAL_TABLET | Freq: Once | ORAL | Status: AC
  Administered 2017-01-16: 60 mg via ORAL
  Filled 2017-01-16: qty 3

## 2017-01-16 MED ORDER — ALBUTEROL SULFATE (2.5 MG/3ML) 0.083% IN NEBU: 2.5000 mg | INHALATION_SOLUTION | RESPIRATORY_TRACT | 0 refills | Status: DC | PRN

## 2017-01-16 MED ORDER — PREDNISONE 20 MG PO TABS: 40.0000 mg | ORAL_TABLET | Freq: Every day | ORAL | 0 refills | Status: DC

## 2017-01-16 MED ORDER — ALBUTEROL SULFATE (2.5 MG/3ML) 0.083% IN NEBU
2.5000 mg | INHALATION_SOLUTION | Freq: Once | RESPIRATORY_TRACT | Status: AC
  Administered 2017-01-16: 2.5 mg via RESPIRATORY_TRACT
  Filled 2017-01-16: qty 3

## 2017-01-16 MED ORDER — ALBUTEROL SULFATE HFA 108 (90 BASE) MCG/ACT IN AERS
2.0000 | INHALATION_SPRAY | RESPIRATORY_TRACT | Status: DC | PRN
  Administered 2017-01-16: 2 via RESPIRATORY_TRACT
  Filled 2017-01-16: qty 6.7

## 2017-01-16 NOTE — ED Provider Notes (Signed)
[facility]-EMERGENCY DEPT Provider Note   CSN: [id number] Arrival date & time: 01/15/17  2156     History   Chief Complaint Chief Complaint  Patient presents with  . Chest Pain    HPI Alexandra Holt is a 41 y.o. female.  Patient presents to the ER for evaluation of cough, chest congestion, shortness of breath. Patient does have a history of bronchitis with similar symptoms.She has been coughing a lot, mostly nonproductive. Now she is experiencing pain across both of her anterior rib cage area as well as into her back every time she coughs. She has not had any fever.      Past Medical History:  Diagnosis Date  . Anxiety   . Asthma   . Depression   . H/O: drug dependency (HCC)   . Migraine   . Narcotic addiction (HCC)    takes Suboxone    Patient Active Problem List   Diagnosis Date Noted  . Weight gain 06/15/2013  . Bronchitis 06/12/2013  . GASTROENTERITIS 07/13/2009  . 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 CELLULITIS AND ABSCESS OF FACE 01/06/2008  . ARTHRITIS 01/06/2008  . NECK PAIN 06/09/2007  . DEPRESSION 01/01/2007  . DIZZINESS OR VERTIGO 01/01/2007  . HEADACHE 01/01/2007    Past Surgical History:  Procedure Laterality Date  . ECTOPIC PREGNANCY SURGERY    . TOOTH EXTRACTION      OB History    Gravida Para Term Preterm AB Living   SAB TAB Ectopic Multiple Live Births     4 2           Home Medications    Prior to Admission medications   Medication Sig Start Date End Date Taking? Authorizing Provider  albuterol (PROVENTIL) (2.5 MG/3ML) 0.083% nebulizer solution Take 3 mLs (2.5 mg total) by nebulization every 4 (four) hours as needed for wheezing or shortness of breath. 01/16/17   Gilda Crease, MD  ALPRAZolam Prudy Feeler) 0.5 MG tablet Take 1 tablet (0.5 mg total) by mouth 3 (three) times daily as needed for anxiety. Patient not taking: Reported on 09/18/2016 01/24/16 01/23/17  Loleta Rose, MD  buprenorphine (SUBUTEX) 8 MG  SUBL SL tablet Place 16 mg under the tongue daily.    [provider]  FLUoxetine (PROZAC) 40 MG capsule Take 80 mg by mouth at bedtime.     [provider]  HYDROcodone-homatropine (HYCODAN) 5-1.5 MG/5ML syrup Take 5 mLs by mouth every 6 (six) hours as needed for cough. 01/16/17   Gilda Crease, MD  naproxen (NAPROSYN) 500 MG tablet Take 1 tablet (500 mg total) by mouth 2 (two) times daily. 09/18/16   Elpidio Anis, PA-C  predniSONE (DELTASONE) 20 MG tablet Take 2 tablets (40 mg total) by mouth daily with breakfast. 01/16/17   Trilby Way, Canary Brim, MD    Family History Family History  Problem Relation Age of Onset  . Diabetes Other   . Hypertension Other   . Cancer Maternal Aunt     Social History Social History  Substance Use Topics  . Smoking status: Current Every Day Smoker    Packs/day: 1.00    Years: 20.00    Types: Cigarettes  . Smokeless tobacco: Never Used  . Alcohol use No     Allergies   Penicillins and Sulfonamide derivatives   Review of Systems Review of Systems  Respiratory: Positive for cough, shortness of breath and wheezing.   All other systems reviewed and are  negative.    Physical Exam Updated Vital Signs BP 137/81   Pulse 74   Temp 98.4 F (36.9 C) (Oral)   Resp 14   Ht  (1.803 m)   Wt 99.8 kg (220 lb)   LMP 12/27/2016   SpO2 97%   BMI 30.68 kg/m   Physical Exam  Constitutional: She is oriented to person, place, and time. She appears well-developed and well-nourished. No distress.  HENT:  Head: Normocephalic and atraumatic.  Right Ear: Hearing normal.  Left Ear: Hearing normal.  Nose: Nose normal.  Mouth/Throat: Oropharynx is clear and moist and mucous membranes are normal.  Eyes: Pupils are equal, round, and reactive to light. Conjunctivae and EOM are normal.  Neck: Normal range of motion. Neck supple.  Cardiovascular: Regular rhythm, S1 normal and S2 normal.  Exam reveals no gallop and no friction  rub.   No murmur heard. Pulmonary/Chest: Effort normal. Tachypnea noted. No respiratory distress. She has decreased breath sounds. She has wheezes. She exhibits no tenderness.  Abdominal: Soft. Normal appearance and bowel sounds are normal. There is no hepatosplenomegaly. There is no tenderness. There is no rebound, no guarding, no tenderness at McBurney's point and negative Murphy's sign. No hernia.  Musculoskeletal: Normal range of motion.  Neurological: She is alert and oriented to person, place, and time. She has normal strength. No cranial nerve deficit or sensory deficit. Coordination normal. GCS eye subscore is 4. GCS verbal subscore is 5. GCS motor subscore is 6.  Skin: Skin is warm, dry and intact. No rash noted. No cyanosis.  Psychiatric: She has a normal mood and affect. Her speech is normal and behavior is normal. Thought content normal.  Nursing note and vitals reviewed.    ED Treatments / Results  Labs (all labs ordered are listed, but only abnormal results are displayed) Labs Reviewed  BASIC METABOLIC PANEL  CBC  I-STAT TROPONIN, ED  I-STAT BETA HCG BLOOD, ED ([facility], [facility], [facility] ONLY)    EKG  EKG Interpretation  Date/Time:  Thursday January 15 2017 22:10:07 EDT Ventricular Rate:  81 PR Interval:  124 QRS Duration: 90 QT Interval:  370 QTC Calculation: 429 R Axis:   -15 Text Interpretation:  Normal sinus rhythm with sinus arrhythmia Septal infarct , age undetermined Abnormal ECG No significant change since last tracing Confirmed by Gilda Crease [phone]) on 01/16/2017 1:45:18 AM       Radiology Dg Chest 2 View  Result Date: 01/15/2017 CLINICAL DATA:  Dyspnea and dry cough x1 month. EXAM: CHEST  2 VIEW COMPARISON:  08/27/2016 FINDINGS: The heart size and mediastinal contours are within normal limits. Both lungs are clear. The visualized skeletal structures are unremarkable. IMPRESSION: No active cardiopulmonary disease. Electronically Signed   By: Tollie Eth  M.D.   On: 01/15/2017 22:47    Procedures Procedures (including critical care time)  Medications Ordered in ED Medications  albuterol (PROVENTIL HFA;VENTOLIN HFA) 108 (90 Base) MCG/ACT inhaler 2 puff (not administered)  predniSONE (DELTASONE) tablet 60 mg (60 mg Oral Given 01/16/17 0204)  ipratropium-albuterol (DUONEB) 0.5-2.5 (3) MG/3ML nebulizer solution 3 mL (3 mLs Nebulization Given 01/16/17 0204)  albuterol (PROVENTIL) (2.5 MG/3ML) 0.083% nebulizer solution 2.5 mg (2.5 mg Nebulization Given 01/16/17 0204)     Initial Impression / Assessment and Plan / ED Course  I have reviewed the triage vital signs and the nursing notes.  Pertinent labs & imaging results that were available during my care of the patient were reviewed by me and considered  in my medical decision making (see chart for details).     Patient presents to the ER for evaluation of chest pain that is in the setting of cough, chest congestion and bronchospasm. Patient does have a history of recurrent bronchitis with bronchospasm. Cardiac evaluation is negative. Chest x-ray does not show pneumonia. Patient has had significant improvement with bronchodilator therapy. Will continue on his own and albuterol as an outpatient.  Final Clinical Impressions(s) / ED Diagnoses   Final diagnoses:  Bronchitis    New Prescriptions New Prescriptions   ALBUTEROL (PROVENTIL) (2.5 MG/3ML) 0.083% NEBULIZER SOLUTION    Take 3 mLs (2.5 mg total) by nebulization every 4 (four) hours as needed for wheezing or shortness of breath.   HYDROCODONE-HOMATROPINE (HYCODAN) 5-1.5 MG/5ML SYRUP    Take 5 mLs by mouth every 6 (six) hours as needed for cough.   PREDNISONE (DELTASONE) 20 MG TABLET    Take 2 tablets (40 mg total) by mouth daily with breakfast.     Gilda Crease, MD 01/16/17 [phone]

## 2017-12-26 ENCOUNTER — Emergency Department (HOSPITAL_COMMUNITY)
Admission: EM | Admit: 2017-12-26 | Discharge: 2017-12-26 | Disposition: A | Discharge status: Home / Self Care | Payer: BLUE CROSS/BLUE SHIELD | Attending: Emergency Medicine | Admitting: Emergency Medicine

## 2017-12-26 ENCOUNTER — Emergency Department (HOSPITAL_COMMUNITY): Payer: BLUE CROSS/BLUE SHIELD

## 2017-12-26 ENCOUNTER — Other Ambulatory Visit: Payer: Self-pay

## 2017-12-26 ENCOUNTER — Encounter (HOSPITAL_COMMUNITY): Payer: Self-pay | Admitting: Emergency Medicine

## 2017-12-26 DIAGNOSIS — F17210 Nicotine dependence, cigarettes, uncomplicated: Secondary | ICD-10-CM | POA: Insufficient documentation

## 2017-12-26 DIAGNOSIS — J4521 Mild intermittent asthma with (acute) exacerbation: Secondary | ICD-10-CM

## 2017-12-26 DIAGNOSIS — J45909 Unspecified asthma, uncomplicated: Secondary | ICD-10-CM | POA: Insufficient documentation

## 2017-12-26 DIAGNOSIS — Z79899 Other long term (current) drug therapy: Secondary | ICD-10-CM | POA: Diagnosis not present

## 2017-12-26 DIAGNOSIS — R0602 Shortness of breath: Secondary | ICD-10-CM | POA: Diagnosis present

## 2017-12-26 LAB — CBC WITH DIFFERENTIAL/PLATELET
ABS IMMATURE GRANULOCYTES: 0 10*3/uL (ref 0.0–0.1)
BASOS ABS: 0.1 10*3/uL (ref 0.0–0.1)
BASOS PCT: 1 %
EOS ABS: 0.2 10*3/uL (ref 0.0–0.7)
Eosinophils Relative: 3 %
HCT: 42.1 % (ref 36.0–46.0)
Hemoglobin: 13.5 g/dL (ref 12.0–15.0)
IMMATURE GRANULOCYTES: 0 %
Lymphocytes Relative: 36 %
Lymphs Abs: 2.8 10*3/uL (ref 0.7–4.0)
MCH: 30.4 pg (ref 26.0–34.0)
MCHC: 32.1 g/dL (ref 30.0–36.0)
MCV: 94.8 fL (ref 78.0–100.0)
MONOS PCT: 6 %
Monocytes Absolute: 0.4 10*3/uL (ref 0.1–1.0)
Neutro Abs: 4.2 10*3/uL (ref 1.7–7.7)
Neutrophils Relative %: 54 %
PLATELETS: 256 10*3/uL (ref 150–400)
RBC: 4.44 MIL/uL (ref 3.87–5.11)
RDW: 13.2 % (ref 11.5–15.5)
WBC: 7.8 10*3/uL (ref 4.0–10.5)

## 2017-12-26 LAB — I-STAT TROPONIN, ED
Troponin i, poc: 0 ng/mL (ref 0.00–0.08)
Troponin i, poc: 0 ng/mL (ref 0.00–0.08)

## 2017-12-26 LAB — I-STAT CHEM 8, ED
BUN: 11 mg/dL (ref 6–20)
CALCIUM ION: 1.1 mmol/L — AB (ref 1.15–1.40)
CHLORIDE: 105 mmol/L (ref 98–111)
Creatinine, Ser: 0.6 mg/dL (ref 0.44–1.00)
GLUCOSE: 104 mg/dL — AB (ref 70–99)
HCT: 41 % (ref 36.0–46.0)
Hemoglobin: 13.9 g/dL (ref 12.0–15.0)
Potassium: 4.2 mmol/L (ref 3.5–5.1)
SODIUM: 137 mmol/L (ref 135–145)
TCO2: 24 mmol/L (ref 22–32)

## 2017-12-26 LAB — I-STAT BETA HCG BLOOD, ED (MC, WL, AP ONLY): I-stat hCG, quantitative: <5 m[IU]/mL (ref <5)

## 2017-12-26 MED ORDER — ALBUTEROL SULFATE (2.5 MG/3ML) 0.083% IN NEBU
INHALATION_SOLUTION | RESPIRATORY_TRACT | Status: AC
  Administered 2017-12-26: 5 mg via RESPIRATORY_TRACT
  Filled 2017-12-26: qty 6

## 2017-12-26 MED ORDER — PREDNISONE 20 MG PO TABS
60.0000 mg | ORAL_TABLET | Freq: Once | ORAL | Status: AC
  Administered 2017-12-26: 60 mg via ORAL
  Filled 2017-12-26: qty 3

## 2017-12-26 MED ORDER — IPRATROPIUM-ALBUTEROL 0.5-2.5 (3) MG/3ML IN SOLN
3.0000 mL | Freq: Once | RESPIRATORY_TRACT | Status: AC
  Administered 2017-12-26: 3 mL via RESPIRATORY_TRACT
  Filled 2017-12-26: qty 3

## 2017-12-26 MED ORDER — ALBUTEROL SULFATE (2.5 MG/3ML) 0.083% IN NEBU: 2.5000 mg | INHALATION_SOLUTION | RESPIRATORY_TRACT | 0 refills | Status: DC | PRN

## 2017-12-26 MED ORDER — ALBUTEROL SULFATE (2.5 MG/3ML) 0.083% IN NEBU
5.0000 mg | INHALATION_SOLUTION | Freq: Once | RESPIRATORY_TRACT | Status: AC
  Administered 2017-12-26: 5 mg via RESPIRATORY_TRACT

## 2017-12-26 MED ORDER — PREDNISONE 20 MG PO TABS: ORAL_TABLET | ORAL | 0 refills | Status: DC

## 2017-12-26 NOTE — ED Notes (Signed)
Recollected dark green tube for istat labs due to previous blood expired.

## 2017-12-26 NOTE — ED Triage Notes (Signed)
C/o increased SOB and wheezing x 2 weeks.  States she is out of Albuterol and has been SOB all summer but worse the last 2 weeks.  Also reports bilateral ears "clogged" and burning to center of chest when she coughs.

## 2017-12-26 NOTE — ED Provider Notes (Signed)
[facility] EMERGENCY DEPARTMENT Provider Note   CSN: [id number] Arrival date & time: 12/26/17  0032     History   Chief Complaint Chief Complaint  Patient presents with  . Shortness of Breath  . Chest Pain    HPI Alexandra Holt is a 42 y.o. female.  The history is provided by the patient.  Wheezing   This is a recurrent problem. The current episode started more than 1 week ago. The problem occurs constantly. The problem has not changed since onset.Associated symptoms include cough. Pertinent negatives include no chest pain, no fever, no vomiting, no diarrhea, no sore throat, no neck pain, no hemoptysis, no sputum production and no rash. Associated symptoms comments: Burns when she coughs but is still smoking.  . The problem's precipitants include smoke. She has tried nothing for the symptoms. The treatment provided no relief. She has had no prior hospitalizations. She has had prior ED visits. She has had no prior ICU admissions. Her past medical history is significant for asthma.    Past Medical History:  Diagnosis Date  . Anxiety   . Asthma   . Depression   . H/O: drug dependency (HCC)   . Migraine   . Narcotic addiction (HCC)    takes Suboxone    Patient Active Problem List   Diagnosis Date Noted  . Weight gain 06/15/2013  . Bronchitis 06/12/2013  . GASTROENTERITIS 07/13/2009  . 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 CELLULITIS AND ABSCESS OF FACE 01/06/2008  . ARTHRITIS 01/06/2008  . NECK PAIN 06/09/2007  . DEPRESSION 01/01/2007  . DIZZINESS OR VERTIGO 01/01/2007  . HEADACHE 01/01/2007    Past Surgical History:  Procedure Laterality Date  . ECTOPIC PREGNANCY SURGERY    . TOOTH EXTRACTION       OB History    Gravida  7   Para  1   Term  1   Preterm      AB  6   Living  1     SAB      TAB  4   Ectopic  2   Multiple      Live Births               Home Medications    Prior to Admission medications     Medication Sig Start Date End Date Taking? Authorizing Provider  albuterol (PROVENTIL) (2.5 MG/3ML) 0.083% nebulizer solution Take 3 mLs (2.5 mg total) by nebulization every 4 (four) hours as needed for wheezing or shortness of breath. 01/16/17   Gilda Crease, MD  buprenorphine (SUBUTEX) 8 MG SUBL SL tablet Place 16 mg under the tongue daily.    [provider]  FLUoxetine (PROZAC) 40 MG capsule Take 80 mg by mouth at bedtime.     [provider]  HYDROcodone-homatropine (HYCODAN) 5-1.5 MG/5ML syrup Take 5 mLs by mouth every 6 (six) hours as needed for cough. 01/16/17   Gilda Crease, MD  naproxen (NAPROSYN) 500 MG tablet Take 1 tablet (500 mg total) by mouth 2 (two) times daily. 09/18/16   Elpidio Anis, PA-C  predniSONE (DELTASONE) 20 MG tablet Take 2 tablets (40 mg total) by mouth daily with breakfast. 01/16/17   Pollina, Canary Brim, MD    Family History Family History  Problem Relation Age of Onset  . Diabetes Other   . Hypertension Other   . Cancer Maternal Aunt     Social History Social History   Tobacco Use  .  Smoking status: Current Every Day Smoker    Packs/day: 1.00    Years: 20.00    Pack years: 20.00    Types: Cigarettes  . Smokeless tobacco: Never Used  Substance Use Topics  . Alcohol use: No  . Drug use: Yes    Types: Cocaine    Comment: opiates     Allergies   Penicillins and Sulfonamide derivatives   Review of Systems Review of Systems  Constitutional: Negative for diaphoresis and fever.  HENT: Negative for sore throat and voice change.   Eyes: Negative for visual disturbance.  Respiratory: Positive for cough and wheezing. Negative for hemoptysis and sputum production.   Cardiovascular: Negative for chest pain, palpitations and leg swelling.  Gastrointestinal: Negative for diarrhea and vomiting.  Genitourinary: Negative for flank pain.  Musculoskeletal: Negative for neck pain.  Skin: Negative for rash.   Neurological: Negative for facial asymmetry and weakness.  All other systems reviewed and are negative.    Physical Exam Updated Vital Signs BP (!) 122/101   Pulse 98   Temp 98.2 F (36.8 C) (Oral)   Resp (!) 22   LMP 12/25/2017   SpO2 99%   Physical Exam  Constitutional: She is oriented to person, place, and time. She appears well-developed and well-nourished. No distress.  HENT:  Head: Normocephalic and atraumatic.  Right Ear: External ear normal.  Left Ear: External ear normal.  Nose: Nose normal.  Mouth/Throat: Oropharynx is clear and moist. No oropharyngeal exudate.  Eyes: Pupils are equal, round, and reactive to light. Conjunctivae are normal.  Neck: Normal range of motion. Neck supple.  Cardiovascular: Normal rate, regular rhythm, normal heart sounds and intact distal pulses.  Pulmonary/Chest: No respiratory distress. She has wheezes.  Abdominal: Soft. Bowel sounds are normal. There is no tenderness. There is no rebound and no guarding.  Musculoskeletal: Normal range of motion. She exhibits no edema.  Neurological: She is alert and oriented to person, place, and time. She displays normal reflexes.  Skin: Skin is warm and dry. Capillary refill takes less than 2 seconds.  Psychiatric: She has a normal mood and affect.     ED Treatments / Results  Labs (all labs ordered are listed, but only abnormal results are displayed) Labs Reviewed  I-STAT CHEM 8, ED - Abnormal; Notable for the following components:      Result Value   Glucose, Bld 104 (*)    Calcium, Ion 1.10 (*)    All other components within normal limits  CBC WITH DIFFERENTIAL/PLATELET  I-STAT TROPONIN, ED  I-STAT BETA HCG BLOOD, ED ([facility], [facility], [facility] ONLY)  I-STAT TROPONIN, ED    EKG EKG Interpretation  Date/Time:  Saturday December 26 2017 00:39:56 EDT Ventricular Rate:  98 PR Interval:  130 QRS Duration: 94 QT Interval:  350 QTC Calculation: 446 R Axis:   -40 Text Interpretation:  Normal sinus  rhythm Left axis deviation Incomplete right bundle branch block Septal infarct , age undetermined Abnormal ECG When compared with ECG of 01/15/2018,  No significant change was found Confirmed by Dione Booze ([id number]) on 12/26/2017 1:49:52 AM   Radiology Dg Chest 2 View  Result Date: 12/26/2017 CLINICAL DATA:  Increased shortness of breath and wheezing for 2 weeks. Bilateral ears clogged and burning to the central chest with cough. EXAM: CHEST - 2 VIEW COMPARISON:  01/15/2017 FINDINGS: Heart size and pulmonary vascularity are normal. Central interstitial change with peribronchial thickening suggesting airways disease. No consolidation or edema. No blunting of costophrenic angles. No pneumothorax.  Mediastinal contours appear intact. IMPRESSION: Peribronchial changes suggesting airways disease. No consolidation or edema. Electronically Signed   By: Burman Nieves M.D.   On: 12/26/2017 01:12    Procedures Procedures (including critical care time)  Medications Ordered in ED Medications  albuterol (PROVENTIL) (2.5 MG/3ML) 0.083% nebulizer solution 5 mg (5 mg Nebulization Given 12/26/17 0047)  ipratropium-albuterol (DUONEB) 0.5-2.5 (3) MG/3ML nebulizer solution 3 mL (3 mLs Nebulization Given 12/26/17 0440)  predniSONE (DELTASONE) tablet 60 mg (60 mg Oral Given 12/26/17 0440)     PERC negative wells 0 highly doubt PE in this low risk patient  Final Clinical Impressions(s) / ED Diagnoses    Return for fevers >100.4 unrelieved by medication, shortness of breath, intractable vomiting, or diarrhea, Inability to tolerate liquids or food, cough, altered mental status or any concerns. No signs of systemic illness or infection. The patient is nontoxic-appearing on exam and vital signs are within normal limits.   I have reviewed the triage vital signs and the nursing notes. Pertinent labs &imaging results that were available during my care of the patient were reviewed by me and considered in my medical  decision making (see chart for details).  After history, exam, and medical workup I feel the patient has been appropriately medically screened and is safe for discharge home. Pertinent diagnoses were discussed with the patient. Patient was given return precautions.     Lora Chavers, MD 12/26/17 0500

## 2019-02-21 ENCOUNTER — Encounter (HOSPITAL_BASED_OUTPATIENT_CLINIC_OR_DEPARTMENT_OTHER): Payer: Self-pay | Admitting: *Deleted

## 2019-02-21 ENCOUNTER — Other Ambulatory Visit: Payer: Self-pay

## 2019-02-21 ENCOUNTER — Emergency Department (HOSPITAL_BASED_OUTPATIENT_CLINIC_OR_DEPARTMENT_OTHER)
Admission: EM | Admit: 2019-02-21 | Discharge: 2019-02-21 | Disposition: A | Discharge status: Home / Self Care | Payer: BLUE CROSS/BLUE SHIELD | Attending: Emergency Medicine | Admitting: Emergency Medicine

## 2019-02-21 DIAGNOSIS — Z880 Allergy status to penicillin: Secondary | ICD-10-CM | POA: Insufficient documentation

## 2019-02-21 DIAGNOSIS — Y999 Unspecified external cause status: Secondary | ICD-10-CM | POA: Insufficient documentation

## 2019-02-21 DIAGNOSIS — W57XXXA Bitten or stung by nonvenomous insect and other nonvenomous arthropods, initial encounter: Secondary | ICD-10-CM | POA: Insufficient documentation

## 2019-02-21 DIAGNOSIS — J45909 Unspecified asthma, uncomplicated: Secondary | ICD-10-CM | POA: Insufficient documentation

## 2019-02-21 DIAGNOSIS — Y929 Unspecified place or not applicable: Secondary | ICD-10-CM | POA: Insufficient documentation

## 2019-02-21 DIAGNOSIS — Z882 Allergy status to sulfonamides status: Secondary | ICD-10-CM | POA: Insufficient documentation

## 2019-02-21 DIAGNOSIS — Y939 Activity, unspecified: Secondary | ICD-10-CM | POA: Insufficient documentation

## 2019-02-21 DIAGNOSIS — F17210 Nicotine dependence, cigarettes, uncomplicated: Secondary | ICD-10-CM | POA: Insufficient documentation

## 2019-02-21 DIAGNOSIS — S20462A Insect bite (nonvenomous) of left back wall of thorax, initial encounter: Secondary | ICD-10-CM

## 2019-02-21 NOTE — ED Provider Notes (Signed)
Emergency Department Provider Note   I have reviewed the triage vital signs and the nursing notes.   HISTORY  Chief Complaint Insect Bite   HPI Alexandra Holt is a 43 y.o. female past medical history reviewed below presents to the emergency department with sharp, electric type pain after she suspects she was bitten by an insect.  She has 2 areas of pain.  Patient states that she suddenly felt sharp pain followed by burning.  She took 50 mg of Benadryl prior to arrival in the emergency department as she has experienced some itching with bee stings in the past.  She denies any sore throat, sensation of throat tightness, shortness of breath, additional rash.  Symptoms began approximately 30 minutes prior to ED presentation.  No radiation of symptoms or other modifying factors.  Past Medical History:  Diagnosis Date  . Anxiety   . Asthma   . Depression   . H/O: drug dependency ([place])   . Migraine   . Narcotic addiction ([place])    takes Suboxone    Patient Active Problem List   Diagnosis Date Noted  . Weight gain 06/15/2013  . Bronchitis 06/12/2013  . GASTROENTERITIS 07/13/2009  . 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 CELLULITIS AND ABSCESS OF FACE 01/06/2008  . ARTHRITIS 01/06/2008  . NECK PAIN 06/09/2007  . DEPRESSION 01/01/2007  . DIZZINESS OR VERTIGO 01/01/2007  . HEADACHE 01/01/2007    Past Surgical History:  Procedure Laterality Date  . ECTOPIC PREGNANCY SURGERY    . TOOTH EXTRACTION      Allergies Penicillins and Sulfonamide derivatives  Family History  Problem Relation Age of Onset  . Diabetes Other   . Hypertension Other   . Cancer Maternal Aunt     Social History Social History   Tobacco Use  . Smoking status: Current Every Day Smoker    Packs/day: 1.00    Years: 20.00    Pack years: 20.00    Types: Cigarettes  . Smokeless tobacco: Never Used  Substance Use Topics  . Alcohol use: No  . Drug use: Yes    Types: Cocaine    Comment:  opiates    Review of Systems  Constitutional: No fever/chills Skin: Question insect bite/rash.  Neurological: Negative for headaches.  10-point ROS otherwise negative.  ____________________________________________   PHYSICAL EXAM:  VITAL SIGNS: ED Triage Vitals  Enc Vitals Group     BP 02/21/19 1257 (!) 147/61     Pulse Rate 02/21/19 1257 84     Resp 02/21/19 1257 18     Temp 02/21/19 1257 98.3 F (36.8 C)     Temp Source 02/21/19 1257 Oral     SpO2 02/21/19 1257 99 %     Weight 02/21/19 1254 205 lb (93 kg)     Height 02/21/19 1254 5\' 11"  (1.803 m)   Constitutional: Alert and oriented. Well appearing and in no acute distress. Eyes: Conjunctivae are normal.  Head: Atraumatic. Nose: No congestion/rhinnorhea. Mouth/Throat: Mucous membranes are moist. Neck: No stridor. Cardiovascular: Normal rate, regular rhythm.  Respiratory: Normal respiratory effort.  No retractions. Lungs CTAB. Gastrointestinal: No distention.  Musculoskeletal: No gross deformities of extremities. Neurologic:  Normal speech and language.  Skin: 2 semicircular, slightly raised areas over the mid-back. No fluctuance or cellulitis.   ____________________________________________   PROCEDURES  Procedure(s) performed:   Procedures  None  ____________________________________________   INITIAL IMPRESSION / ASSESSMENT AND PLAN / ED COURSE  Pertinent labs & imaging results that were available during  my care of the patient were reviewed by me and considered in my medical decision making (see chart for details).   Patient presents to the emergency department with 2 areas of likely insect sting to her back.  Symptoms began acutely without evidence of infection.  No findings on exam or historical features to suspect impending anaphylactic type reaction.  Plan for symptom management along with over-the-counter pain medications as needed.  Discussed ED return precautions and PCP follow up plan.     ____________________________________________  FINAL CLINICAL IMPRESSION(S) / ED DIAGNOSES  Final diagnoses:  Insect bite of left back wall of thorax, initial encounter    Note:  This document was prepared using Dragon voice recognition software and may include unintentional dictation errors.  Alona Bene, MD, [facility]    , Arlyss Repress, MD 02/21/19 [phone]

## 2019-02-21 NOTE — Discharge Instructions (Signed)
You were seen in the emergency department today with likely insect sting to the back.  You can apply a cool compress and take Tylenol and/or Motrin as needed for pain.  Take Benadryl as instructed on the box for any itching.  Return to the emergency department immediately with any new or suddenly worsening symptoms.

## 2019-02-21 NOTE — ED Triage Notes (Signed)
She has 2 insect bites on her back that are burning. She took Benadryl 50mg  prior to coming to the ED.

## 2019-10-25 ENCOUNTER — Other Ambulatory Visit: Payer: Self-pay

## 2019-10-25 ENCOUNTER — Emergency Department (HOSPITAL_COMMUNITY)
Admission: EM | Admit: 2019-10-25 | Discharge: 2019-10-25 | Disposition: A | Discharge status: Home / Self Care | Payer: Self-pay | Attending: Emergency Medicine | Admitting: Emergency Medicine

## 2019-10-25 ENCOUNTER — Encounter (HOSPITAL_COMMUNITY): Payer: Self-pay | Admitting: Emergency Medicine

## 2019-10-25 DIAGNOSIS — Y638 Failure in dosage during other surgical and medical care: Secondary | ICD-10-CM | POA: Insufficient documentation

## 2019-10-25 DIAGNOSIS — F1123 Opioid dependence with withdrawal: Secondary | ICD-10-CM | POA: Insufficient documentation

## 2019-10-25 DIAGNOSIS — Z7951 Long term (current) use of inhaled steroids: Secondary | ICD-10-CM | POA: Insufficient documentation

## 2019-10-25 DIAGNOSIS — J45909 Unspecified asthma, uncomplicated: Secondary | ICD-10-CM | POA: Insufficient documentation

## 2019-10-25 DIAGNOSIS — T507X5A Adverse effect of analeptics and opioid receptor antagonists, initial encounter: Secondary | ICD-10-CM | POA: Insufficient documentation

## 2019-10-25 DIAGNOSIS — F1193 Opioid use, unspecified with withdrawal: Secondary | ICD-10-CM

## 2019-10-25 DIAGNOSIS — F1410 Cocaine abuse, uncomplicated: Secondary | ICD-10-CM | POA: Insufficient documentation

## 2019-10-25 DIAGNOSIS — F17210 Nicotine dependence, cigarettes, uncomplicated: Secondary | ICD-10-CM | POA: Insufficient documentation

## 2019-10-25 MED ORDER — DROPERIDOL 2.5 MG/ML IJ SOLN
1.2500 mg | Freq: Once | INTRAMUSCULAR | Status: AC
  Administered 2019-10-25: 1.25 mg via INTRAMUSCULAR
  Filled 2019-10-25: qty 2

## 2019-10-25 MED ORDER — DIPHENHYDRAMINE HCL 50 MG/ML IJ SOLN
25.0000 mg | Freq: Once | INTRAMUSCULAR | Status: AC
  Administered 2019-10-25: 25 mg via INTRAMUSCULAR
  Filled 2019-10-25: qty 1

## 2019-10-25 MED ORDER — BUPRENORPHINE HCL-NALOXONE HCL 8-2 MG SL SUBL
1.0000 | SUBLINGUAL_TABLET | Freq: Once | SUBLINGUAL | Status: AC
  Administered 2019-10-25: 1 via SUBLINGUAL
  Filled 2019-10-25: qty 1

## 2019-10-25 MED ORDER — LORAZEPAM 2 MG/ML IJ SOLN
2.0000 mg | Freq: Once | INTRAMUSCULAR | Status: AC
  Administered 2019-10-25: 2 mg via INTRAMUSCULAR
  Filled 2019-10-25: qty 1

## 2019-10-25 MED ORDER — CHLORDIAZEPOXIDE HCL 25 MG PO CAPS
100.0000 mg | ORAL_CAPSULE | Freq: Once | ORAL | Status: AC
  Administered 2019-10-25: 100 mg via ORAL
  Filled 2019-10-25: qty 4

## 2019-10-25 MED ORDER — ONDANSETRON 4 MG PO TBDP: ORAL_TABLET | ORAL | 0 refills | Status: DC

## 2019-10-25 MED ORDER — CHLORDIAZEPOXIDE HCL 25 MG PO CAPS: ORAL_CAPSULE | ORAL | 0 refills | Status: DC

## 2019-10-25 MED ORDER — KETAMINE HCL 50 MG/ML IJ SOLN: 100.0000 mg | Freq: Once | INTRAMUSCULAR | Status: DC

## 2019-10-25 NOTE — ED Triage Notes (Signed)
Patient arrived by EMS from home. Patient started new medication (Vivitrol) around 0900 this morning.   Patient was given medication for Alcohol Abuse. Patient stated she feels as though she's having withdrawal symptoms "cant hold still, muscle spasms, nausea, dry heave".   5mg  of Versed given IM per EMS.   VS WDL per EMS.

## 2019-10-25 NOTE — Discharge Instructions (Signed)
Return for worsening symptoms.    

## 2019-10-25 NOTE — ED Provider Notes (Signed)
3:41 PM Care assumed from Dr. Adela Lank.  At time of transfer of care, patient is awaiting treatment with IM ketamine for management of pain.  Anticipate plan will be to discharge with prescription of Zofran and Librium taper that Dr. Adela Lank will be prescribing.  If patient starts to feel better and can tolerate p.o., anticipate discharge home shortly.  4:48 PM Nursing just informing that patient has been doing better and the patient and family do not want to do ketamine and would rather go home.  She has been not been agitated and been lying calmly for at least an hour and he feels safe with her going home to use her home Suboxone.  Will be given the prescriptions that Dr. Adela Lank wrote and will be discharged home.  They understand return precautions and follow-up instructions.  They were discharged in good condition.  Clinical Impression: 1. Opiate withdrawal (HCC)     Disposition: Discharge  Condition: Good  I have discussed the results, Dx and Tx plan with the pt(& family if present). He/she/they expressed understanding and agree(s) with the plan. Discharge instructions discussed at great length. Strict return precautions discussed and pt &/or family have verbalized understanding of the instructions. No further questions at time of discharge.    New Prescriptions   CHLORDIAZEPOXIDE (LIBRIUM) 25 MG CAPSULE    50mg  PO TID x 1D, then 25-50mg  PO BID X 1D, then 25-50mg  PO QD X 1D   ONDANSETRON (ZOFRAN ODT) 4 MG DISINTEGRATING TABLET    4mg  ODT q4 hours prn nausea/vomit    Follow Up: , MD [address] [place] [address] [place] [phone]     [facility] [facility] DEPT [address] [facility] mc [place] [id number] [place] [phone]         Tysha Grismore, [id number], MD 10/25/19 1650

## 2019-10-25 NOTE — ED Provider Notes (Signed)
[facility]-EMERGENCY DEPT Provider Note   CSN: [id number] Arrival date & time: 10/25/19  1135     History Chief Complaint  Patient presents with  . Allergic Reaction    Medication     Alexandra Holt is a 44 y.o. female.  44 yo F with a chief complaints of feeling like her skin is crawling about her legs are hurting severely.  This been going on since this morning.  And after she took her first dose of naltrexone.  She is chronically on Suboxone.  She denies any injection of any medicine.  Denies illegal drug use.  Denies overdosing on any of her medicines.  She feels like she cannot sit still and she has to get up and move around.  The history is provided by the patient.  Allergic Reaction Presenting symptoms: no wheezing   Illness Severity:  Moderate Onset quality:  Gradual Duration:  2 hours Timing:  Constant Progression:  Worsening Chronicity:  New Associated symptoms: no chest pain, no congestion, no fever, no headaches, no myalgias, no nausea, no rhinorrhea, no shortness of breath, no vomiting and no wheezing        Past Medical History:  Diagnosis Date  . Anxiety   . Asthma   . Depression   . H/O: drug dependency (HCC)   . Migraine   . Narcotic addiction (HCC)    takes Suboxone    Patient Active Problem List   Diagnosis Date Noted  . Weight gain 06/15/2013  . Bronchitis 06/12/2013  . GASTROENTERITIS 07/13/2009  . 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 CELLULITIS AND ABSCESS OF FACE 01/06/2008  . ARTHRITIS 01/06/2008  . NECK PAIN 06/09/2007  . DEPRESSION 01/01/2007  . DIZZINESS OR VERTIGO 01/01/2007  . HEADACHE 01/01/2007    Past Surgical History:  Procedure Laterality Date  . ECTOPIC PREGNANCY SURGERY    . TOOTH EXTRACTION       OB History    Gravida  7   Para  1   Term  1   Preterm      AB  6   Living  1     SAB      TAB  4   Ectopic  2   Multiple      Live Births              Family  History  Problem Relation Age of Onset  . Diabetes Other   . Hypertension Other   . Cancer Maternal Aunt     Social History   Tobacco Use  . Smoking status: Current Every Day Smoker    Packs/day: 1.00    Years: 20.00    Pack years: 20.00    Types: Cigarettes  . Smokeless tobacco: Never Used  Substance Use Topics  . Alcohol use: No  . Drug use: Yes    Types: Cocaine    Comment: opiates    Home Medications Prior to Admission medications   Medication Sig Start Date End Date Taking? Authorizing Provider  albuterol (PROVENTIL) (2.5 MG/3ML) 0.083% nebulizer solution Take 3 mLs (2.5 mg total) by nebulization every 4 (four) hours as needed for wheezing or shortness of breath. 12/26/17   Palumbo, April, MD  chlordiazePOXIDE (LIBRIUM) 25 MG capsule 50mg  PO TID x 1D, then 25-50mg  PO BID X 1D, then 25-50mg  PO QD X 1D 10/25/19   12/26/19, DO  HYDROcodone-homatropine [facility]) 5-1.5 MG/5ML syrup Take 5 mLs by mouth every 6 (six) hours as needed  for cough. Patient not taking: Reported on 12/26/2017 01/16/17   Gilda Crease, MD  Ixekizumab (TALTZ [address]) Inject into the skin.    [provider]  naproxen (NAPROSYN) 500 MG tablet Take 1 tablet (500 mg total) by mouth 2 (two) times daily. Patient not taking: Reported on 12/26/2017 09/18/16   Elpidio Anis, PA-C  ondansetron (ZOFRAN ODT) 4 MG disintegrating tablet 4mg  ODT q4 hours prn nausea/vomit 10/25/19   12/26/19, DO  predniSONE (DELTASONE) 20 MG tablet Take 2 tablets (40 mg total) by mouth daily with breakfast. Patient not taking: Reported on 12/26/2017 01/16/17   01/18/17, MD  predniSONE (DELTASONE) 20 MG tablet 3 tabs po day one, then 2 po daily x 4 days 12/26/17   Palumbo, April, MD  SUBOXONE 8-2 MG FILM Place 1 Film under the tongue 2 (two) times daily. 11/13/17   [provider]    Allergies    Penicillins and Sulfonamide derivatives  Review of Systems   Review of Systems  Constitutional: Positive for  activity change. Negative for chills and fever.  HENT: Negative for congestion and rhinorrhea.   Eyes: Negative for redness and visual disturbance.  Respiratory: Negative for shortness of breath and wheezing.   Cardiovascular: Negative for chest pain and palpitations.  Gastrointestinal: Negative for nausea and vomiting.  Genitourinary: Negative for dysuria and urgency.  Musculoskeletal: Positive for arthralgias (leg pain). Negative for myalgias.  Skin: Negative for pallor and wound.  Neurological: Negative for dizziness and headaches.  Psychiatric/Behavioral: The patient is nervous/anxious and is hyperactive.     Physical Exam Updated Vital Signs BP (!) 147/75 (BP Location: Right Arm)   Pulse 70   Temp (!) 97.5 F (36.4 C) (Axillary)   Resp 20   Ht 5\' 11"  (1.803 m)   Wt 83.9 kg   LMP  (LMP Unknown)   SpO2 99%   BMI 25.80 kg/m   Physical Exam Vitals and nursing note reviewed.  Constitutional:      General: She is not in acute distress.    Appearance: She is well-developed. She is not diaphoretic.     Comments: Appears uncomfortable  HENT:     Head: Normocephalic and atraumatic.  Eyes:     Pupils: Pupils are equal, round, and reactive to light.  Cardiovascular:     Rate and Rhythm: Normal rate and regular rhythm.     Heart sounds: No murmur heard.  No friction rub. No gallop.   Pulmonary:     Effort: Pulmonary effort is normal.     Breath sounds: No wheezing or rales.  Abdominal:     General: There is no distension.     Palpations: Abdomen is soft.     Tenderness: There is no abdominal tenderness.  Musculoskeletal:        General: No tenderness.     Cervical back: Normal range of motion and neck supple.  Skin:    General: Skin is warm and dry.  Neurological:     Mental Status: She is alert and oriented to person, place, and time.  Psychiatric:        Behavior: Behavior is agitated.     ED Results / Procedures / Treatments   Labs (all labs ordered are  listed, but only abnormal results are displayed) Labs Reviewed - No data to display  EKG None  Radiology No results found.  Procedures Procedures (including critical care time)  Medications Ordered in ED Medications  ketamine (KETALAR) injection 100 mg (has  no administration in time range)  buprenorphine-naloxone (SUBOXONE) 8-2 mg per SL tablet 1 tablet (1 tablet Sublingual Given 10/25/19 1235)  diphenhydrAMINE (BENADRYL) injection 25 mg (25 mg Intramuscular Given 10/25/19 1240)  droperidol (INAPSINE) 2.5 MG/ML injection 1.25 mg (1.25 mg Intramuscular Given 10/25/19 1238)  LORazepam (ATIVAN) injection 2 mg (2 mg Intramuscular Given 10/25/19 1323)  chlordiazePOXIDE (LIBRIUM) capsule 100 mg (100 mg Oral Given 10/25/19 1323)  LORazepam (ATIVAN) injection 2 mg (2 mg Intramuscular Given 10/25/19 1433)  diphenhydrAMINE (BENADRYL) injection 25 mg (25 mg Intramuscular Given 10/25/19 1436)    ED Course  I have reviewed the triage vital signs and the nursing notes.  Pertinent labs & imaging results that were available during my care of the patient were reviewed by me and considered in my medical decision making (see chart for details).    MDM Rules/Calculators/A&P                          44 yo F with a chief complaints of feeling anxious.  I think the patient was put into withdrawal by being started on naltrexone when she is a current patient for Suboxone.  We will try and improve her symptoms.  She did arrive via EMS after getting 5 mg of Versed with minimal improvement.  We will try a dose of Suboxone here though I suspect that it probably will not improve her symptoms as more likely naltrexone has a higher binding affinity to the opiate receptor.  We will give a dose of droperidol and Benadryl.  Patient requiring multiple doses of medications here.  Some improvements in dozing off and on.  When awake still seems somewhat uncomfortable.  Will try a dose of pain dose ketamine IM.  Will attempt to  discharge at that time.  Librium taper Zofran addiction management follow-up.  The patients results and plan were reviewed and discussed.   Any x-rays performed were independently reviewed by myself.   Differential diagnosis were considered with the presenting HPI.  Medications  ketamine (KETALAR) injection 100 mg (has no administration in time range)  buprenorphine-naloxone (SUBOXONE) 8-2 mg per SL tablet 1 tablet (1 tablet Sublingual Given 10/25/19 1235)  diphenhydrAMINE (BENADRYL) injection 25 mg (25 mg Intramuscular Given 10/25/19 1240)  droperidol (INAPSINE) 2.5 MG/ML injection 1.25 mg (1.25 mg Intramuscular Given 10/25/19 1238)  LORazepam (ATIVAN) injection 2 mg (2 mg Intramuscular Given 10/25/19 1323)  chlordiazePOXIDE (LIBRIUM) capsule 100 mg (100 mg Oral Given 10/25/19 1323)  LORazepam (ATIVAN) injection 2 mg (2 mg Intramuscular Given 10/25/19 1433)  diphenhydrAMINE (BENADRYL) injection 25 mg (25 mg Intramuscular Given 10/25/19 1436)    Vitals:   10/25/19 1149 10/25/19 1150  BP: (!) 147/75   Pulse: 70   Resp: 20   Temp: (!) 97.5 F (36.4 C)   TempSrc: Axillary   SpO2: 99%   Weight:  83.9 kg  Height:  5\' 11"  (1.803 m)    Final diagnoses:  Opiate withdrawal (HCC)      Final Clinical Impression(s) / ED Diagnoses Final diagnoses:  Opiate withdrawal (HCC)    Rx / DC Orders ED Discharge Orders         Ordered    ondansetron (ZOFRAN ODT) 4 MG disintegrating tablet     Discontinue  Reprint     10/25/19 1542    chlordiazePOXIDE (LIBRIUM) 25 MG capsule     Discontinue  Reprint     10/25/19 1542  Melene Plan, DO 10/25/19 1544

## 2019-10-25 NOTE — ED Notes (Signed)
Pt antsy and uncomfortable unable to stay still in bed at this time. Dr. Adela Lank currently at bedside speaking with family and pt.

## 2019-10-26 ENCOUNTER — Telehealth (HOSPITAL_COMMUNITY): Payer: Self-pay | Admitting: Emergency Medicine

## 2019-10-26 MED ORDER — CHLORDIAZEPOXIDE HCL 25 MG PO CAPS: ORAL_CAPSULE | ORAL | 0 refills | Status: DC

## 2019-10-26 NOTE — Telephone Encounter (Signed)
Pt called saying [facility] did not have Librium.  Rx sent to [facility].

## 2020-09-06 ENCOUNTER — Telehealth: Payer: Self-pay

## 2020-09-06 NOTE — Telephone Encounter (Signed)
  HAD GASTRIC SLEEVE SURGERY, STRESS TEST STATES THAT SHE HAS RECENTLY HAD A HEART ATTACK RECENTLY, PT HASNT BEEN SEEN BY [place] BEFORE AND HAS NO REFERRAL, PT IS SELF PAY AS WELL, EDU PT THAT A REFERRAL IS NEEDED

## 2020-09-09 NOTE — Progress Notes (Signed)
Cardiology Office Note   Date:  09/12/2020   ID:  Alexandra Holt, DOB 11/22/1975, MRN [id number]  PCP:  Kaleen Mask, MD  Cardiologist:   Kisha Messman [place], MD   Chief Complaint  Patient presents with  . Abnormal ECG      History of Present Illness: Alexandra Holt is a 45 y.o. female who is seen at the request of Dr Jeannetta Nap for evaluation of abnormal Ecg. She has a history of narcotic addiction and obesity. She underwent bariatric surgery in April in [place] with a gastric sleeve. She has a history of tobacco abuse but quit one year ago. Ecgs dating back to 2015 have shown intermittent possible old septal infarct. More recent Ecg done 08/11/20 showed LAD with septal infarct with QS complex in V1-2.   She had prior cardiac evaluation with Echo and stress Echo on August 11, 2020. Echo showed mild LVH with normal EF. Stress Echo was normal with limited exercise tolerance.      Past Medical History:  Diagnosis Date  . Anxiety   . Asthma   . Depression   . H/O: drug dependency (HCC)   . Migraine   . Narcotic addiction (HCC)    takes Suboxone    Past Surgical History:  Procedure Laterality Date  . ECTOPIC PREGNANCY SURGERY    . TOOTH EXTRACTION       Current Outpatient Medications  Medication Sig Dispense Refill  . albuterol (PROVENTIL) (2.5 MG/3ML) 0.083% nebulizer solution Take 3 mLs (2.5 mg total) by nebulization every 4 (four) hours as needed for wheezing or shortness of breath. 10 vial 0  . aspirin EC 81 MG tablet Take 81 mg by mouth daily. Swallow whole.    . cloNIDine (CATAPRES) 0.1 MG tablet Take 0.1 mg by mouth 2 (two) times daily.    Marland Kitchen FLUoxetine (PROZAC) 20 MG tablet Take 20 mg by mouth in the morning and at bedtime.    . Omega-3 300 MG CAPS Take by mouth.    . ondansetron (ZOFRAN ODT) 4 MG disintegrating tablet 4mg  ODT q4 hours prn nausea/vomit 20 tablet 0  . SUBOXONE 8-2 MG FILM Place 1 Film under the tongue 2 (two) times daily.  0   No current  facility-administered medications for this visit.    Allergies:   Penicillins and Sulfonamide derivatives    Social History:  The patient  reports that she quit smoking about a year ago. Her smoking use included cigarettes. She has a 20.00 pack-year smoking history. She has never used smokeless tobacco. She reports current drug use. Drug: Cocaine. She reports that she does not drink alcohol.   Family History:  The patient's family history includes Cancer in her maternal aunt; Diabetes in an other family member; Heart attack in her father; Hypertension in an other family member; Liver disease in her mother.    ROS:  Please see the history of present illness.   Otherwise, review of systems are positive for none.   All other systems are reviewed and negative.    PHYSICAL EXAM: VS:  BP (!) 124/94 (BP Location: Left Arm, Patient Position: Sitting, Cuff Size: Large)   Pulse 78   Ht 5\' 11"  (1.803 m)   Wt 264 lb 9.6 oz (120 kg)   BMI 36.90 kg/m  , BMI Body mass index is 36.9 kg/m. GEN: Well nourished, obese, in no acute distress  HEENT: normal  Neck: no JVD, carotid bruits, or masses Cardiac: RRR; no murmurs, rubs,  or gallops,no edema  Respiratory:  clear to auscultation bilaterally, normal work of breathing GI: soft, nontender, nondistended, + BS MS: no deformity or atrophy  Skin: warm and dry, no rash Neuro:  Strength and sensation are intact Psych: euthymic mood, full affect   EKG:  EKG is ordered today. The ekg ordered today demonstrates NSR with LAD. I have personally reviewed and interpreted this study.    Recent Labs: No results found for requested labs within last 8760 hours.   Labs dated 08/11/20: Normal CMET. Hgb 12.5. A1c 5.2%.  Lipid Panel No results found for: CHOL, TRIG, HDL, CHOLHDL, VLDL, LDLCALC, LDLDIRECT    Wt Readings from Last 3 Encounters:  09/12/20 264 lb 9.6 oz (120 kg)  10/25/19 185 lb (83.9 kg)  02/21/19 205 lb (93 kg)      Other studies  Reviewed: Additional studies/ records that were reviewed today include: results of Echo and stress Echo - will enter into record   ASSESSMENT AND PLAN:  1.  Abnormal Ecg. This is normal for her. ? Septal infarct which reflects more her body habitus. She has a normal Echo and stress Echo this year. I reassured her concerning this. No evidence of prior infarct. No further evaluation needed. 2. Morbid obesity. S/p gastric sleeve surgery. Has lost 35 lbs. 3. Tobacco abuse. Reports quitting one year ago. Encouraged continued smoking cessation.  4. Family history of CAD. Focus on risk factors modification.   Current medicines are reviewed at length with the patient today.  The patient does not have concerns regarding medicines.  The following changes have been made:  no change  Labs/ tests ordered today include:  No orders of the defined types were placed in this encounter.    Disposition:   FU PRN  Signed, Madeleine Fenn [place], MD  09/12/2020 9:46 AM    [facility] [address], [place], [place], [address] Phone [phone], Fax [phone]

## 2020-09-12 ENCOUNTER — Other Ambulatory Visit: Payer: Self-pay

## 2020-09-12 ENCOUNTER — Ambulatory Visit (INDEPENDENT_AMBULATORY_CARE_PROVIDER_SITE_OTHER): Payer: Self-pay | Admitting: Cardiology

## 2020-09-12 ENCOUNTER — Encounter: Payer: Self-pay | Admitting: Cardiology

## 2020-09-12 VITALS — BP 124/94 | HR 78 | Ht 71.0 in | Wt 264.6 lb

## 2020-09-12 DIAGNOSIS — R9431 Abnormal electrocardiogram [ECG] [EKG]: Secondary | ICD-10-CM

## 2020-09-12 DIAGNOSIS — Z720 Tobacco use: Secondary | ICD-10-CM

## 2021-06-23 ENCOUNTER — Emergency Department (HOSPITAL_BASED_OUTPATIENT_CLINIC_OR_DEPARTMENT_OTHER): Payer: Self-pay

## 2021-06-23 ENCOUNTER — Encounter (HOSPITAL_BASED_OUTPATIENT_CLINIC_OR_DEPARTMENT_OTHER): Payer: Self-pay | Admitting: Emergency Medicine

## 2021-06-23 ENCOUNTER — Emergency Department (HOSPITAL_BASED_OUTPATIENT_CLINIC_OR_DEPARTMENT_OTHER)
Admission: EM | Admit: 2021-06-23 | Discharge: 2021-06-23 | Disposition: A | Discharge status: Home / Self Care | Payer: Self-pay | Attending: Emergency Medicine | Admitting: Emergency Medicine

## 2021-06-23 ENCOUNTER — Other Ambulatory Visit: Payer: Self-pay

## 2021-06-23 DIAGNOSIS — J45909 Unspecified asthma, uncomplicated: Secondary | ICD-10-CM | POA: Insufficient documentation

## 2021-06-23 DIAGNOSIS — R197 Diarrhea, unspecified: Secondary | ICD-10-CM | POA: Insufficient documentation

## 2021-06-23 DIAGNOSIS — Z7982 Long term (current) use of aspirin: Secondary | ICD-10-CM | POA: Insufficient documentation

## 2021-06-23 DIAGNOSIS — R195 Other fecal abnormalities: Secondary | ICD-10-CM

## 2021-06-23 DIAGNOSIS — R112 Nausea with vomiting, unspecified: Secondary | ICD-10-CM | POA: Insufficient documentation

## 2021-06-23 DIAGNOSIS — R1084 Generalized abdominal pain: Secondary | ICD-10-CM | POA: Insufficient documentation

## 2021-06-23 LAB — URINALYSIS, ROUTINE W REFLEX MICROSCOPIC
Bilirubin Urine: NEGATIVE
Glucose, UA: NEGATIVE mg/dL
Hgb urine dipstick: NEGATIVE
Ketones, ur: NEGATIVE mg/dL
Leukocytes,Ua: NEGATIVE
Nitrite: NEGATIVE
Specific Gravity, Urine: 1.019 (ref 1.005–1.030)
pH: 6 (ref 5.0–8.0)

## 2021-06-23 LAB — HEPATIC FUNCTION PANEL
ALT: 14 U/L (ref 0–44)
AST: 32 U/L (ref 15–41)
Albumin: 4.2 g/dL (ref 3.5–5.0)
Alkaline Phosphatase: 75 U/L (ref 38–126)
Bilirubin, Direct: 0.1 mg/dL (ref 0.0–0.2)
Indirect Bilirubin: 0.3 mg/dL (ref 0.3–0.9)
Total Bilirubin: 0.4 mg/dL (ref 0.3–1.2)
Total Protein: 7.7 g/dL (ref 6.5–8.1)

## 2021-06-23 LAB — BASIC METABOLIC PANEL
Anion gap: 9 (ref 5–15)
BUN: 8 mg/dL (ref 6–20)
CO2: 25 mmol/L (ref 22–32)
Calcium: 9.2 mg/dL (ref 8.9–10.3)
Chloride: 104 mmol/L (ref 98–111)
Creatinine, Ser: 0.61 mg/dL (ref 0.44–1.00)
GFR, Estimated: >60 mL/min (ref >60)
Glucose, Bld: 94 mg/dL (ref 70–99)
Potassium: 5.3 mmol/L — ABNORMAL HIGH (ref 3.5–5.1)
Sodium: 138 mmol/L (ref 135–145)

## 2021-06-23 LAB — PREGNANCY, URINE: Preg Test, Ur: NEGATIVE

## 2021-06-23 LAB — CBC
HCT: 43.2 % (ref 36.0–46.0)
Hemoglobin: 13.8 g/dL (ref 12.0–15.0)
MCH: 29.5 pg (ref 26.0–34.0)
MCHC: 31.9 g/dL (ref 30.0–36.0)
MCV: 92.3 fL (ref 80.0–100.0)
Platelets: 230 10*3/uL (ref 150–400)
RBC: 4.68 MIL/uL (ref 3.87–5.11)
RDW: 13.6 % (ref 11.5–15.5)
WBC: 2.8 10*3/uL — ABNORMAL LOW (ref 4.0–10.5)
nRBC: 0 % (ref 0.0–0.2)

## 2021-06-23 LAB — LIPASE, BLOOD: Lipase: 22 U/L (ref 11–51)

## 2021-06-23 MED ORDER — IOHEXOL 300 MG/ML  SOLN
100.0000 mL | Freq: Once | INTRAMUSCULAR | Status: AC | PRN
  Administered 2021-06-23: 100 mL via INTRAVENOUS

## 2021-06-23 MED ORDER — ONDANSETRON 4 MG PO TBDP
4.0000 mg | ORAL_TABLET | Freq: Once | ORAL | Status: AC
  Administered 2021-06-23: 4 mg via ORAL
  Filled 2021-06-23: qty 1

## 2021-06-23 MED ORDER — ONDANSETRON 4 MG PO TBDP: 4.0000 mg | ORAL_TABLET | Freq: Three times a day (TID) | ORAL | 0 refills | Status: DC | PRN

## 2021-06-23 MED ORDER — LACTATED RINGERS IV BOLUS
1000.0000 mL | Freq: Once | INTRAVENOUS | Status: AC
  Administered 2021-06-23: 1000 mL via INTRAVENOUS

## 2021-06-23 NOTE — ED Notes (Signed)
Patient transported to CT 

## 2021-06-23 NOTE — ED Provider Notes (Signed)
[facility] EMERGENCY DEPT Provider Note   CSN: [id number] Arrival date & time: 06/23/21  1253     History  Chief Complaint  Patient presents with   Flank Pain    Alexandra Holt is a 46 y.o. female.  HPI      46 year old female with a history of asthma, anxiety, migraine, narcotic addiction, prior history of gastric sleeve who presents with concern for nausea, vomiting, abdominal pain.  Friday night woke upat 2AM, vomiting Vomiting 8-10 times since then Yesterday difficulty keeping things down, today had egg No burning with urination, urine smelled strong No change in loose stool that had from prior gastric sleeve Temperature 100.1, has some nightsweats poss menopausal, is more pronounced this week  No recent antibiotics, no recent travel, no known sick contacts  No chest pain or shortntess of breath  Abdominal pain was top of abdomen and also down low, then followed by going to bathroom loose stool  Denies vaginal discharge, STI concerns Taking suboxone, constipated normally   Past Medical History:  Diagnosis Date   Anxiety    Asthma    Depression    H/O: drug dependency (HCC)    Migraine    Narcotic addiction (HCC)    takes Suboxone    Past Surgical History:  Procedure Laterality Date   ECTOPIC PREGNANCY SURGERY     TOOTH EXTRACTION       Home Medications Prior to Admission medications   Medication Sig Start Date End Date Taking? Authorizing Provider  ondansetron (ZOFRAN-ODT) 4 MG disintegrating tablet Take 1 tablet (4 mg total) by mouth every 8 (eight) hours as needed for nausea or vomiting. 06/23/21  Yes Alvira Monday, MD  albuterol (PROVENTIL) (2.5 MG/3ML) 0.083% nebulizer solution Take 3 mLs (2.5 mg total) by nebulization every 4 (four) hours as needed for wheezing or shortness of breath. 12/26/17   Palumbo, April, MD  aspirin EC 81 MG tablet Take 81 mg by mouth daily. Swallow whole.    [provider]  cloNIDine (CATAPRES) 0.1 MG  tablet Take 0.1 mg by mouth 2 (two) times daily.    [provider]  FLUoxetine (PROZAC) 20 MG tablet Take 20 mg by mouth in the morning and at bedtime.    [provider]  Omega-3 300 MG CAPS Take by mouth.    [provider]  SUBOXONE 8-2 MG FILM Place 1 Film under the tongue 2 (two) times daily. 11/13/17   [provider]      Allergies    Penicillins and Sulfonamide derivatives    Review of Systems   Review of Systems See above  Physical Exam Updated Vital Signs BP 139/78    Pulse 64    Temp 98 F (36.7 C) (Oral)    Resp 16    Ht 6' (1.829 m)    Wt 113.4 kg    SpO2 98%    BMI 33.91 kg/m  Physical Exam Vitals and nursing note reviewed.  Constitutional:      General: She is not in acute distress.    Appearance: She is well-developed. She is not diaphoretic.  HENT:     Head: Normocephalic and atraumatic.  Eyes:     Conjunctiva/sclera: Conjunctivae normal.  Cardiovascular:     Rate and Rhythm: Normal rate and regular rhythm.  Pulmonary:     Effort: Pulmonary effort is normal. No respiratory distress.     Breath sounds: Normal breath sounds. No wheezing or rales.  Abdominal:  General: There is no distension.     Palpations: Abdomen is soft.     Tenderness: There is abdominal tenderness (mild). There is no guarding.  Musculoskeletal:        General: No tenderness.     Cervical back: Normal range of motion.  Skin:    General: Skin is warm and dry.     Findings: No erythema or rash.  Neurological:     Mental Status: She is alert and oriented to person, place, and time.    ED Results / Procedures / Treatments   Labs (all labs ordered are listed, but only abnormal results are displayed) Labs Reviewed  URINALYSIS, ROUTINE W REFLEX MICROSCOPIC - Abnormal; Notable for the following components:      Result Value   Protein, ur TRACE (*)    All other components within normal limits  CBC - Abnormal; Notable for the following components:    WBC 2.8 (*)    All other components within normal limits  BASIC METABOLIC PANEL - Abnormal; Notable for the following components:   Potassium 5.3 (*)    All other components within normal limits  PREGNANCY, URINE  LIPASE, BLOOD  HEPATIC FUNCTION PANEL    EKG None  Radiology CT ABDOMEN PELVIS W CONTRAST  Result Date: 06/23/2021 CLINICAL DATA:  Abdominal pain. History of gastric sleeve procedure. EXAM: CT ABDOMEN AND PELVIS WITH CONTRAST TECHNIQUE: Multidetector CT imaging of the abdomen and pelvis was performed using the standard protocol following bolus administration of intravenous contrast. RADIATION DOSE REDUCTION: This exam was performed according to the departmental dose-optimization program which includes automated exposure control, adjustment of the mA and/or kV according to patient size and/or use of iterative reconstruction technique. CONTRAST:  OMNIPAQUE IOHEXOL 300 MG/ML  SOLN COMPARISON:  03/12/2014 FINDINGS: Lower chest: No acute abnormality. Hepatobiliary: No focal liver abnormality is seen. No gallstones, gallbladder wall thickening, or biliary dilatation. Pancreas: Unremarkable. No pancreatic ductal dilatation or surrounding inflammatory changes. Spleen: Normal in size without focal abnormality. Adrenals/Urinary Tract: Adrenal glands are unremarkable. Stable nonobstructing punctate left inferior pole renal calculi. No other urolithiasis. No ureterolithiasis. No obstructive uropathy. No renal mass. Stomach/Bowel: Prior gastric sleeve procedure. Stomach is otherwise unremarkable. No evidence of bowel wall thickening, distention, or inflammatory changes. Appendix is normal. Vascular/Lymphatic: No significant vascular findings are present. No enlarged abdominal or pelvic lymph nodes. Reproductive: Normal caliber abdominal aorta with mild atherosclerosis. No lymphadenopathy. Other: No abdominopelvic ascites. Small fat containing supraumbilical hernia. Musculoskeletal: Degenerative  disease with disc height loss at L5-S1 and L3-4. IMPRESSION: 1. No acute abdominal or pelvic pathology. 2. Multiple punctate nonobstructing left renal calculi unchanged from the prior exam. 3. Small fat containing supraumbilical hernia. 4. Aortic Atherosclerosis (ICD10-I70.0). Electronically Signed   By: Elige Ko M.D.   On: 06/23/2021 14:25    Procedures Procedures    Medications Ordered in ED Medications  ondansetron (ZOFRAN-ODT) disintegrating tablet 4 mg (4 mg Oral Given 06/23/21 1322)  iohexol (OMNIPAQUE) 300 MG/ML solution 100 mL (100 mLs Intravenous Contrast Given 06/23/21 1406)  lactated ringers bolus 1,000 mL (1,000 mLs Intravenous New Bag/Given 06/23/21 1415)    ED Course/ Medical Decision Making/ A&P                           Medical Decision Making Amount and/or Complexity of Data Reviewed Labs: ordered. Radiology: ordered.  Risk Prescription drug management.    46 year old female with a history of asthma, anxiety, migraine, narcotic  addiction, prior history of gastric sleeve who presents with concern for nausea, vomiting, abdominal pain.  DDx includes appendicitis, pancreatitis, cholecystitis, pyelonephritis, intraabdominal abscess, SBO, nephrolithiasis, diverticulitis, PID, ovarian torsion, ectopic pregnancy, and tuboovarian abscess.  Given abdominal pain, nausea, vomiting, hx of abdominal surgeries, CT abdomen pelvis completed to evaluate for SBO or other acute abnormalities completed and shows no acute abnormalities. Do not suspect pelvic etiology of pain. Pregnancy test negative. UA without signs of infection. Labs evaluated by me show bmp with hemolysis/normal Cr,(suspect K WNL) , no sign of pancreatitis or hepatitis.  Possible viral etiology of emesis and increased loose stools.  Do not feel hx and exam consistent with ACS. Nausea improved, able to tolerate po. Given rx for zofran. Patient discharged in stable condition with understanding of reasons to return.          Final Clinical Impression(s) / ED Diagnoses Final diagnoses:  Nausea and vomiting, unspecified vomiting type  Loose stools  Generalized abdominal pain    Rx / DC Orders ED Discharge Orders          Ordered    ondansetron (ZOFRAN-ODT) 4 MG disintegrating tablet  Every 8 hours PRN        06/23/21 1517              Alvira Monday, MD 06/24/21 2321

## 2021-06-23 NOTE — ED Notes (Signed)
Pt refuses covid/flu swab.  

## 2021-06-23 NOTE — ED Triage Notes (Signed)
Pt reports foul smelling odor, right flank pain and nausea for a few days. 1 episode of emesis today.  ?

## 2021-06-23 NOTE — ED Notes (Signed)
Dc instructions reviewed with patient. Patient voiced understanding. Dc with belongings.  °

## 2022-02-10 ENCOUNTER — Ambulatory Visit
Admission: EM | Admit: 2022-02-10 | Discharge: 2022-02-10 | Disposition: A | Discharge status: Home / Self Care | Payer: Worker's Compensation | Attending: Family Medicine | Admitting: Family Medicine

## 2022-02-10 DIAGNOSIS — S0990XA Unspecified injury of head, initial encounter: Secondary | ICD-10-CM

## 2022-02-10 DIAGNOSIS — S40011A Contusion of right shoulder, initial encounter: Secondary | ICD-10-CM

## 2022-02-10 DIAGNOSIS — S8991XA Unspecified injury of right lower leg, initial encounter: Secondary | ICD-10-CM

## 2022-02-10 DIAGNOSIS — S20222A Contusion of left back wall of thorax, initial encounter: Secondary | ICD-10-CM

## 2022-02-10 HISTORY — DX: Psoriasis, unspecified: L40.9

## 2022-02-10 HISTORY — DX: Unspecified osteoarthritis, unspecified site: M19.90

## 2022-02-10 MED ORDER — IBUPROFEN 800 MG PO TABS: 800.0000 mg | ORAL_TABLET | Freq: Three times a day (TID) | ORAL | 0 refills | Status: AC

## 2022-02-10 MED ORDER — CYCLOBENZAPRINE HCL 10 MG PO TABS: 10.0000 mg | ORAL_TABLET | Freq: Two times a day (BID) | ORAL | 0 refills | Status: DC | PRN

## 2022-02-10 NOTE — Discharge Instructions (Signed)
Ice to injured areas for 20 minutes every couple of hours Take ibuprofen 3 times a day with food Take Flexeril as muscle relaxer, as needed.  This may cause drowsiness Avoid bending and lifting activities while muscles heal  If you develop headache, dizziness, change in vision, vomiting, or any sign of head injury you must go to the emergency room

## 2022-02-10 NOTE — ED Provider Notes (Signed)
[facility]    CSN: [id number] Arrival date & time: 02/10/22  0843      History   Chief Complaint Chief Complaint  Patient presents with   Head Injury    HPI Alexandra Holt is a 46 y.o. female.   HPI  Patient states at work today she had a heavy object fall on her head.  Another operator was lifting a mixer with a forklift and missed the destination causing her to fall.  She states that the mixer weighed about 80 pounds.  It hit her on top of the head.  It knocked her backwards into other's and pallets.  She is here with multiple injuries.  She does not think she had a loss of consciousness.  She is not having any headache, dizziness, nausea or vomiting, or change in vision to suggest head injury.  She has multiple bruises.  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 Authorizing Provider  cyclobenzaprine (FLEXERIL) 10 MG tablet Take 1 tablet (10 mg total) by mouth 2 (two) times daily as needed for muscle spasms. 02/10/22  Yes Eustace Moore, MD  gabapentin (NEURONTIN) 300  MG capsule Take 300 mg by mouth 3 (three) times daily.   Yes [provider]  ibuprofen (ADVIL) 800 MG tablet Take 1 tablet (800 mg total) by mouth 3 (three) times daily. 02/10/22  Yes Eustace Moore, MD  Ixekizumab (TALTZ [place]) Inject into the skin.   Yes [provider]  cloNIDine (CATAPRES) 0.1 MG tablet Take 0.1 mg by mouth 2 (two) times daily.    [provider]  FLUoxetine (PROZAC) 20 MG tablet Take 20 mg by mouth in the morning and at bedtime.    [provider]  SUBOXONE 8-2 MG FILM Place 1 Film under the tongue 2 (two) times daily. 11/13/17   [provider]    Family History Family History  Problem Relation Age of Onset   Diabetes Other    Hypertension Other    Cancer Maternal Aunt    Liver disease Mother    Heart attack Father     Social History Social History   Tobacco Use   Smoking status:  Former    Packs/day: 1.00    Years: 20.00    Total pack years: 20.00    Types: Cigarettes    Quit date: 09/13/2019    Years since quitting: 2.4   Smokeless tobacco: Never  Substance Use Topics   Alcohol use: No   Drug use: Yes    Types: Cocaine    Comment: opiates     Allergies   Penicillins and Sulfonamide derivatives   Review of Systems Review of Systems See HPI  Physical Exam Triage Vital Signs ED Triage Vitals  Enc Vitals Group     BP 02/10/22 0856 (!) 178/99     Pulse Rate 02/10/22 0856 65     Resp 02/10/22 0856 14     Temp 02/10/22 0856 97.9 F (36.6 C)     Temp Source 02/10/22 0856 Oral     SpO2 02/10/22 0856 97 %     Weight 02/10/22 0852 195 lb (88.5 kg)     Height --      Head Circumference --      Peak Flow --      Pain Score 02/10/22 0852 4     Pain Loc --      Pain Edu? --      Excl. in GC? --    No data found.  Updated Vital Signs BP (!) 178/99 (BP Location: Right Arm)   Pulse 65   Temp 97.9 F (36.6 C) (Oral)   Resp 14   Wt 88.5 kg   SpO2 97%   BMI 26.45 kg/m      Physical  Exam Constitutional:      General: She is not in acute distress.    Appearance: Normal appearance. She is well-developed.  HENT:     Head: Normocephalic.   Eyes:     Conjunctiva/sclera: Conjunctivae normal.     Pupils: Pupils are equal, round, and reactive to light.  Cardiovascular:     Rate and Rhythm: Normal rate.  Pulmonary:     Effort: Pulmonary effort is normal. No respiratory distress.  Abdominal:     General: There is no distension.     Palpations: Abdomen is soft.  Musculoskeletal:        General: Normal range of motion.       Arms:     Cervical back: Normal range of motion.       Legs:  Skin:    General: Skin is warm and dry.  Neurological:     General: No focal deficit present.     Mental Status: She is alert.      UC Treatments / Results  Labs (all labs ordered are listed, but only abnormal results are displayed) Labs Reviewed - No data to display  EKG   Radiology No results found.  Procedures Procedures (including critical care time)  Medications Ordered in UC Medications - No data to display  Initial Impression / Assessment and Plan / UC Course  I have reviewed the triage vital signs and the nursing notes.  Pertinent labs & imaging results that were available during my care of the patient were reviewed by me and considered in my medical decision making (see chart for details).     Patient has multiple bruised areas from the object hitting her in the head and knocking her to the ground.  I explained she would likely be more sore tomorrow.  I want her to be  at a decreased activity level while these areas heal.  Follow-up  with Worker's Comp. Final Clinical Impressions(s) / UC Diagnoses   Final diagnoses:  Injury of head, initial encounter  Contusion of left side of back, initial encounter  Contusion of right shoulder, initial encounter  Knee injury, right, initial encounter     Discharge Instructions      Ice to injured areas for 20  minutes every couple of hours Take ibuprofen 3 times a day with food Take Flexeril as muscle relaxer, as needed.  This may cause drowsiness Avoid bending and lifting activities while muscles heal  If you develop headache, dizziness, change in vision, vomiting, or any sign of head injury you must go to the emergency room    ED Prescriptions     Medication Sig Dispense Auth. Provider   ibuprofen (ADVIL) 800 MG tablet Take 1 tablet (800 mg total) by mouth 3 (three) times daily. 21 tablet Raylene Everts, MD   cyclobenzaprine (FLEXERIL) 10 MG tablet Take 1 tablet (10 mg total) by mouth 2 (two) times daily as needed for muscle spasms. 20 tablet Raylene Everts, MD      PDMP not reviewed this encounter.   Raylene Everts, MD 02/10/22 [phone]

## 2022-02-10 NOTE — ED Triage Notes (Signed)
Pt presents with c/o being "hit in the head with a large mixer at work this morning". Pt endorses possible LOC. Pt denies any changes in vision, Nausea or vomiting. Pt states she currently has abrasions on her back from falling into pallets during her fall.

## 2022-02-11 ENCOUNTER — Telehealth: Payer: Self-pay

## 2022-02-11 NOTE — Telephone Encounter (Signed)
Pt states she is still sore from injury yesterday. Advised that per Dr Meda Coffee, if sxs worsen, go immediately to ED for further eval. Pt acknowledged.

## 2022-07-16 ENCOUNTER — Other Ambulatory Visit: Payer: Self-pay

## 2022-07-16 ENCOUNTER — Encounter (HOSPITAL_BASED_OUTPATIENT_CLINIC_OR_DEPARTMENT_OTHER): Payer: Self-pay

## 2022-07-16 ENCOUNTER — Emergency Department (HOSPITAL_BASED_OUTPATIENT_CLINIC_OR_DEPARTMENT_OTHER): Payer: BC Managed Care – PPO

## 2022-07-16 ENCOUNTER — Emergency Department (HOSPITAL_BASED_OUTPATIENT_CLINIC_OR_DEPARTMENT_OTHER)
Admission: EM | Admit: 2022-07-16 | Discharge: 2022-07-16 | Disposition: A | Discharge status: Home / Self Care | Payer: BC Managed Care – PPO | Attending: Emergency Medicine | Admitting: Emergency Medicine

## 2022-07-16 ENCOUNTER — Other Ambulatory Visit (HOSPITAL_BASED_OUTPATIENT_CLINIC_OR_DEPARTMENT_OTHER): Payer: Self-pay

## 2022-07-16 DIAGNOSIS — R1032 Left lower quadrant pain: Secondary | ICD-10-CM | POA: Diagnosis not present

## 2022-07-16 DIAGNOSIS — M545 Low back pain, unspecified: Secondary | ICD-10-CM | POA: Diagnosis not present

## 2022-07-16 DIAGNOSIS — R1031 Right lower quadrant pain: Secondary | ICD-10-CM | POA: Diagnosis not present

## 2022-07-16 LAB — CBC WITH DIFFERENTIAL/PLATELET
Abs Immature Granulocytes: 0.02 10*3/uL (ref 0.00–0.07)
Basophils Absolute: 0.1 10*3/uL (ref 0.0–0.1)
Basophils Relative: 1 %
Eosinophils Absolute: 0.1 10*3/uL (ref 0.0–0.5)
Eosinophils Relative: 2 %
HCT: 36.3 % (ref 36.0–46.0)
Hemoglobin: 11.7 g/dL — ABNORMAL LOW (ref 12.0–15.0)
Immature Granulocytes: 0 %
Lymphocytes Relative: 30 %
Lymphs Abs: 1.9 10*3/uL (ref 0.7–4.0)
MCH: 31.5 pg (ref 26.0–34.0)
MCHC: 32.2 g/dL (ref 30.0–36.0)
MCV: 97.8 fL (ref 80.0–100.0)
Monocytes Absolute: 0.5 10*3/uL (ref 0.1–1.0)
Monocytes Relative: 7 %
Neutro Abs: 3.9 10*3/uL (ref 1.7–7.7)
Neutrophils Relative %: 60 %
Platelets: 231 10*3/uL (ref 150–400)
RBC: 3.71 MIL/uL — ABNORMAL LOW (ref 3.87–5.11)
RDW: 12.8 % (ref 11.5–15.5)
WBC: 6.5 10*3/uL (ref 4.0–10.5)
nRBC: 0 % (ref 0.0–0.2)

## 2022-07-16 LAB — COMPREHENSIVE METABOLIC PANEL
ALT: 11 U/L (ref 0–44)
AST: 23 U/L (ref 15–41)
Albumin: 4.2 g/dL (ref 3.5–5.0)
Alkaline Phosphatase: 50 U/L (ref 38–126)
Anion gap: 6 (ref 5–15)
BUN: 13 mg/dL (ref 6–20)
CO2: 28 mmol/L (ref 22–32)
Calcium: 8.8 mg/dL — ABNORMAL LOW (ref 8.9–10.3)
Chloride: 100 mmol/L (ref 98–111)
Creatinine, Ser: 0.69 mg/dL (ref 0.44–1.00)
GFR, Estimated: >60 mL/min (ref >60)
Glucose, Bld: 106 mg/dL — ABNORMAL HIGH (ref 70–99)
Potassium: 4.9 mmol/L (ref 3.5–5.1)
Sodium: 134 mmol/L — ABNORMAL LOW (ref 135–145)
Total Bilirubin: 0.3 mg/dL (ref 0.3–1.2)
Total Protein: 7.1 g/dL (ref 6.5–8.1)

## 2022-07-16 LAB — URINALYSIS, ROUTINE W REFLEX MICROSCOPIC
Bilirubin Urine: NEGATIVE
Glucose, UA: NEGATIVE mg/dL
Hgb urine dipstick: NEGATIVE
Ketones, ur: NEGATIVE mg/dL
Leukocytes,Ua: NEGATIVE
Nitrite: NEGATIVE
Protein, ur: NEGATIVE mg/dL
Specific Gravity, Urine: 1.019 (ref 1.005–1.030)
pH: 7 (ref 5.0–8.0)

## 2022-07-16 LAB — LIPASE, BLOOD: Lipase: 17 U/L (ref 11–51)

## 2022-07-16 LAB — PREGNANCY, URINE: Preg Test, Ur: NEGATIVE

## 2022-07-16 MED ORDER — LIDOCAINE 5 % EX PTCH
1.0000 | MEDICATED_PATCH | CUTANEOUS | 0 refills | Status: AC
  Filled 2022-07-16: qty 30, 30d supply, fill #0

## 2022-07-16 MED ORDER — SODIUM CHLORIDE 0.9 % IV BOLUS
1000.0000 mL | Freq: Once | INTRAVENOUS | Status: AC
  Administered 2022-07-16: 1000 mL via INTRAVENOUS

## 2022-07-16 MED ORDER — NAPROXEN 250 MG PO TABS
500.0000 mg | ORAL_TABLET | Freq: Once | ORAL | Status: AC
  Administered 2022-07-16: 500 mg via ORAL
  Filled 2022-07-16: qty 2

## 2022-07-16 MED ORDER — METHOCARBAMOL 500 MG PO TABS
500.0000 mg | ORAL_TABLET | Freq: Four times a day (QID) | ORAL | 0 refills | Status: AC | PRN
  Filled 2022-07-16: qty 20, 5d supply, fill #0

## 2022-07-16 MED ORDER — ONDANSETRON HCL 4 MG/2ML IJ SOLN
4.0000 mg | Freq: Once | INTRAMUSCULAR | Status: AC
  Administered 2022-07-16: 4 mg via INTRAVENOUS
  Filled 2022-07-16: qty 2

## 2022-07-16 NOTE — ED Provider Notes (Signed)
[facility] Provider Note   CSN: [id number] Arrival date & time: 07/16/22  [id number]     History  Chief Complaint  Patient presents with   Abdominal Pain    Alexandra Holt is a 47 y.o. female.  Past medical history of asthma, anxiety, migraine, takes Suboxone for history of narcotic addiction, prior gastric sleeve surgery.  She presents the ER complaining of bilateral flank pain abdominal pain primarily left lower quadrant.  She states she started having intermittent bilateral flank pain about 3 weeks ago.  She states she thought it may be due to her kidneys.  She called her primary care doctor discussing her complaints and they called in [place].  States she had no urinary complaints at that time.  She finished the Macrobid and felt like she got a little bit better but now the pain is back the past several days.    She states she feels overall unwell including feeling hot though has not had a fever when she is checked.  She states she has vomited several times.  She has vomited once today.  States this seems to come and go as well.  Denies dysuria frequency and urgency.  Denies diarrhea but states she has occasional loose stools which is not normal for her.  No hematemesis or hematochezia.  No shortness of breath, no chest pain  She reports that the pain seems to be going from her flanks and wraps around to left lower quadrant of her abdomen that she states feels like a kidney stone she had about 20 years ago.  HPI     Home Medications Prior to Admission medications   Medication Sig Start Date End Date Taking? Authorizing Provider  lidocaine (LIDODERM) 5 % Place 1 patch onto the skin daily. Remove & Discard patch within 12 hours or as directed by MD 07/16/22  Yes Amedeo Gory, Tryce Surratt A, PA-C  methocarbamol (ROBAXIN) 500 MG tablet Take 1 tablet (500 mg total) by mouth every 6 (six) hours as needed for muscle spasms. 07/16/22  Yes Kaizlee Carlino A, PA-C   nitrofurantoin, macrocrystal-monohydrate, (MACROBID) 100 MG capsule Take 100 mg by mouth 2 (two) times daily. 07/09/22  Yes [provider]  cloNIDine (CATAPRES) 0.1 MG tablet Take 0.1 mg by mouth 2 (two) times daily.    [provider]  FLUoxetine (PROZAC) 20 MG tablet Take 20 mg by mouth in the morning and at bedtime.    [provider]  gabapentin (NEURONTIN) 300 MG capsule Take 300 mg by mouth 3 (three) times daily.    [provider]  ibuprofen (ADVIL) 800 MG tablet Take 1 tablet (800 mg total) by mouth 3 (three) times daily. 02/10/22   Raylene Everts, MD  Ixekizumab (TALTZ Galliano) Inject into the skin.    [provider]  SUBOXONE 8-2 MG FILM Place 1 Film under the tongue 2 (two) times daily. 11/13/17   [provider]      Allergies    Penicillins, Sulfa antibiotics, and Sulfonamide derivatives    Review of Systems   Review of Systems  Physical Exam Updated Vital Signs BP (!) 170/88 (BP Location: Right Arm)   Pulse 71   Temp 97.7 F (36.5 C) (Oral)   Resp 14   Ht 6' (1.829 m)   Wt 97.5 kg   SpO2 100%   BMI 29.16 kg/m  Physical Exam Vitals and nursing note reviewed.  Constitutional:      General: She is  not in acute distress.    Appearance: She is well-developed.  HENT:     Head: Normocephalic and atraumatic.     Mouth/Throat:     Mouth: Mucous membranes are moist.  Eyes:     Conjunctiva/sclera: Conjunctivae normal.  Cardiovascular:     Rate and Rhythm: Normal rate and regular rhythm.     Heart sounds: No murmur heard. Pulmonary:     Effort: Pulmonary effort is normal. No respiratory distress.     Breath sounds: Normal breath sounds.  Abdominal:     General: Bowel sounds are normal.     Palpations: Abdomen is soft.     Tenderness: There is abdominal tenderness in the right lower quadrant and left upper quadrant. There is no right CVA tenderness, left CVA tenderness, guarding or rebound.  Musculoskeletal:         General: No swelling.     Cervical back: Neck supple.     Comments: Muscular tenderness bilateral lumbar area   Skin:    General: Skin is warm and dry.     Capillary Refill: Capillary refill takes less than 2 seconds.  Neurological:     General: No focal deficit present.     Mental Status: She is alert and oriented to person, place, and time.  Psychiatric:        Mood and Affect: Mood normal.     ED Results / Procedures / Treatments   Labs (all labs ordered are listed, but only abnormal results are displayed) Labs Reviewed  CBC WITH DIFFERENTIAL/PLATELET - Abnormal; Notable for the following components:      Result Value   RBC 3.71 (*)    Hemoglobin 11.7 (*)    All other components within normal limits  COMPREHENSIVE METABOLIC PANEL - Abnormal; Notable for the following components:   Sodium 134 (*)    Glucose, Bld 106 (*)    Calcium 8.8 (*)    All other components within normal limits  LIPASE, BLOOD  URINALYSIS, ROUTINE W REFLEX MICROSCOPIC  PREGNANCY, URINE    EKG EKG Interpretation  Date/Time:  Wednesday July 16 2022 09:27:42 EDT Ventricular Rate:  66 PR Interval:  140 QRS Duration: 100 QT Interval:  410 QTC Calculation: 430 R Axis:   -20 Text Interpretation: Sinus rhythm Borderline left axis deviation Probable anteroseptal infarct, old No significant change since last tracing Confirmed by Blanchie Dessert [phone]) on 07/16/2022 9:31:29 AM  Radiology CT Renal Stone Study  Result Date: 07/16/2022 CLINICAL DATA:  Abdominal and flank pain. EXAM: CT ABDOMEN AND PELVIS WITHOUT CONTRAST TECHNIQUE: Multidetector CT imaging of the abdomen and pelvis was performed following the standard protocol without IV contrast. RADIATION DOSE REDUCTION: This exam was performed according to the departmental dose-optimization program which includes automated exposure control, adjustment of the mA and/or kV according to patient size and/or use of iterative reconstruction technique.  COMPARISON:  06/23/2021 FINDINGS: Lower chest: Unremarkable. Hepatobiliary: No suspicious focal abnormality in the liver on this study without intravenous contrast. There is no evidence for gallstones, gallbladder wall thickening, or pericholecystic fluid. No intrahepatic or extrahepatic biliary dilation. Pancreas: No focal mass lesion. No dilatation of the main duct. No intraparenchymal cyst. No peripancreatic edema. Spleen: No splenomegaly. No focal mass lesion. Adrenals/Urinary Tract: No adrenal nodule or mass. Stable small cyst interpolar right kidney. No followup imaging is recommended. A cluster of tiny 1 mm nonobstructing stones noted lower pole left kidney (images 40-42 of series 5). No ureteral or bladder stones. No secondary changes in either  kidney or ureter. Stomach/Bowel: Status post sleeve gastrectomy. Duodenum is normally positioned as is the ligament of Treitz. No small bowel wall thickening. No small bowel dilatation. The terminal ileum is normal. The appendix is normal. No gross colonic mass. No colonic wall thickening. Vascular/Lymphatic: There is mild atherosclerotic calcification of the abdominal aorta without aneurysm. There is no gastrohepatic or hepatoduodenal ligament lymphadenopathy. No retroperitoneal or mesenteric lymphadenopathy. No pelvic sidewall lymphadenopathy. Reproductive: Unremarkable. Other: No intraperitoneal free fluid. Musculoskeletal: No worrisome lytic or sclerotic osseous abnormality. Small fat containing supraumbilical midline ventral hernia again noted. IMPRESSION: 1. No acute findings in the abdomen or pelvis. Specifically, no findings to explain the patient's history of abdominal pain. 2. Tiny nonobstructing left renal stones. 3. Status post sleeve gastrectomy. 4.  Aortic Atherosclerosis (ICD10-I70.0). Electronically Signed   By: Misty Stanley M.D.   On: 07/16/2022 11:21    Procedures Procedures    Medications Ordered in ED Medications  ondansetron (ZOFRAN)  injection 4 mg (4 mg Intravenous Given 07/16/22 1014)  sodium chloride 0.9 % bolus 1,000 mL (0 mLs Intravenous Stopped 07/16/22 1106)  naproxen (NAPROSYN) tablet 500 mg (500 mg Oral Given 07/16/22 1253)    ED Course/ Medical Decision Making/ A&P                             Medical Decision Making This patient presents to the ED for concern of low back pain and vomiting, this involves an extensive number of treatment options, and is a complaint that carries with it a high risk of complications and morbidity.  The differential diagnosis includes UTI, kidney stone, cholecystitis, cholelithiasis, diverticulitis, gastritis, peptic ulcer disease, muscle strain, UTI, ovarian cyst, ovarian torsion, other   Co morbidities that complicate the patient evaluation  Anxiety, asthma, migraines and opiate abuse on Suboxone   Additional history obtained:  Additional history obtained from EMR External records from outside source obtained and reviewed including multiple prior ED visits, previous lab results compared to today's   Lab Tests:  I Ordered, and personally interpreted labs.  The pertinent results include: Labs overall reassuring patient does have slightly decreased hemoglobin 11.7.  Denies any bleeding discussed with patient about outpatient follow-up.  She is not pregnant, urinalysis is negative, lipase is normal as well.   Imaging Studies ordered:  CT abdomen pelvis ordered, no acute process.  Tiny left renal stones that are nonobstructing noted.     Problem List / ED Course / Critical interventions / Medication management  Patient presents with low back pain which is now progressed to also abdominal pain today with vomiting.  Patient nontoxic appearing with reassuring vitals.  Labs that show some mild anemia but otherwise also reassuring.  I reevaluated the patient and she is having pain still very uncomfortable, discussed CT scan at this time patient is willing to undergo CT scan to  rule out intra-abdominal pathology.  Results showed some tiny left nonobstructing stones and aortic atherosclerosis.  No acute findings noted by radiologist.  Discussed the results with the patient and need for outpatient follow-up for incidental findings.  Will treat her with medication to help with pain and muscle relaxers as well as this may be a muscle strain because she does have some tenderness and  pain with movement as well.  Skin finding to suggest this is a herpes zoster.  Patient's blood pressure noted to be elevated.  She is asymptomatic.  Did not take her meds this morning,  instructed to take her medications when she gets home.  I ordered medication including morphine  for pain  Reevaluation of the patient after these medicines showed that the patient improved I have reviewed the patients home medicines and have made adjustments as needed  Amount and/or Complexity of Data Reviewed Labs: ordered. Radiology: ordered.  Risk Prescription drug management.           Final Clinical Impression(s) / ED Diagnoses Final diagnoses:  Acute bilateral low back pain without sciatica    Rx / DC Orders ED Discharge Orders          Ordered    methocarbamol (ROBAXIN) 500 MG tablet  Every 6 hours PRN        07/16/22 1212    lidocaine (LIDODERM) 5 %  Every 24 hours        07/16/22 [address] 07/16/22 1324    Blanchie Dessert, MD 07/16/22 1453

## 2022-07-16 NOTE — ED Triage Notes (Signed)
Abdominal pain with back pain ongoing for several weeks. Was treated for possible UTI with Macrobid by PCP, but no UA was done to confirm. Also, complains with nausea and vomting.

## 2022-07-16 NOTE — Discharge Instructions (Addendum)
Labs and CT scan today were reassuring.  You had some incidental findings on CT scan of aortic atherosclerosis.  Follow-up with your primary care doctor with this.  He had very small kidney stones on the left but they are not obstructing or causing any problems.  Treat your symptoms with muscle relaxers and some medication for pain, follow-up close with your primary care doctor, come back to the ER if you have severe pain, fever, persistent vomiting or any other worsening symptoms
